# Patient Record
Sex: Female | Born: 1990 | Race: White | Hispanic: No | Marital: Single | Smoking: Current every day smoker
Health system: Southern US, Community
[De-identification: ages and names within clinical notes are randomized; demographics above are authoritative.]

## PROBLEM LIST (undated history)

## (undated) DIAGNOSIS — M549 Dorsalgia, unspecified: Secondary | ICD-10-CM

## (undated) DIAGNOSIS — G8929 Other chronic pain: Secondary | ICD-10-CM

## (undated) HISTORY — PX: BACK SURGERY: SHX140

## (undated) HISTORY — PX: TUBAL LIGATION: SHX77

---

## 2011-09-30 ENCOUNTER — Emergency Department (HOSPITAL_COMMUNITY)
Admission: EM | Admit: 2011-09-30 | Discharge: 2011-09-30 | Disposition: A | Discharge status: Home / Self Care | Payer: Medicaid Other | Attending: Emergency Medicine | Admitting: Emergency Medicine

## 2011-09-30 ENCOUNTER — Encounter (HOSPITAL_COMMUNITY): Payer: Self-pay | Admitting: *Deleted

## 2011-09-30 DIAGNOSIS — M259 Joint disorder, unspecified: Secondary | ICD-10-CM | POA: Insufficient documentation

## 2011-09-30 DIAGNOSIS — O8600 Infection of obstetric surgical wound, unspecified: Secondary | ICD-10-CM

## 2011-09-30 DIAGNOSIS — O85 Puerperal sepsis: Secondary | ICD-10-CM | POA: Insufficient documentation

## 2011-09-30 DIAGNOSIS — M899 Disorder of bone, unspecified: Secondary | ICD-10-CM | POA: Insufficient documentation

## 2011-09-30 DIAGNOSIS — M549 Dorsalgia, unspecified: Secondary | ICD-10-CM | POA: Insufficient documentation

## 2011-09-30 DIAGNOSIS — O9989 Other specified diseases and conditions complicating pregnancy, childbirth and the puerperium: Secondary | ICD-10-CM | POA: Insufficient documentation

## 2011-09-30 MED ORDER — CIPROFLOXACIN HCL 500 MG PO TABS: 500.0000 mg | ORAL_TABLET | Freq: Two times a day (BID) | ORAL | Status: AC

## 2011-09-30 MED ORDER — HYDROCODONE-ACETAMINOPHEN 5-325 MG PO TABS: 1.0000 | ORAL_TABLET | ORAL | Status: AC | PRN

## 2011-09-30 NOTE — ED Notes (Signed)
Dressing changed to abdomen

## 2011-09-30 NOTE — ED Provider Notes (Signed)
History  This chart was scribed for Carleene Cooper III, MD by Erskine Emery. This patient was seen in room APA10/APA10 and the patient's care was started at 15:16.   CSN: [id number]  Arrival date & time 09/30/11  1239   First MD Initiated Contact with Patient 09/30/11 1516      Chief Complaint  Patient presents with  . Wound Infection    (Consider location/radiation/quality/duration/timing/severity/associated sxs/prior treatment) HPI Alison Warner is a 21 y.o. female who presents to the Emergency Department complaining of drainage from a wound for the last few days with associated emesis (2 days ago), and infrequent diarrhea. Pt also reports some back pain of a 10/10 severity since the epidural. Pt denies any ear ache, sore throat, coughing, rash, or syncope. Pt has received no treatment yet. Pt reports her health is otherwise good, she has had no other prior surgeries, she is on no medication, and she does not smoke or drink. Pt had a cesarean section on 7/24 at [facility] because she had a fever going into labor. Pt has NKDA. Pt reports she is seeing her OBGYN on Monday.    History reviewed. No pertinent past medical history.  Past Surgical History  Procedure Date  . Cesarean section     History reviewed. No pertinent family history.  History  Substance Use Topics  . Smoking status: Never Smoker   . Smokeless tobacco: Not on file  . Alcohol Use: No    OB History    Grav Para Term Preterm Abortions TAB SAB Ect Mult Living                  Review of Systems  Constitutional: Negative for fever and chills.  HENT: Negative for ear pain and sore throat.   Eyes: Negative for discharge.  Respiratory: Negative for cough and shortness of breath.   Gastrointestinal: Positive for nausea, vomiting and diarrhea.  Genitourinary: Negative for dysuria. Vaginal discharge: brown.  Musculoskeletal: Positive for back pain.  Skin: Positive for wound (draining). Negative for rash.    Neurological: Negative for syncope and weakness.  Psychiatric/Behavioral: Negative for self-injury.    Allergies  Review of patient's allergies indicates no known allergies.  Home Medications   Current Outpatient Rx  Name Route Sig Dispense Refill  . PRENATAL MULTIVITAMIN CH Oral Take 1 tablet by mouth daily.      Triage Vitals: BP 136/87  Pulse 73  Temp 98.7 F (37.1 C) (Oral)  Resp 18  Ht 5\' 9"  (1.753 m)  Wt 131 lb (59.421 kg)  BMI 19.35 kg/m2  SpO2 99%  Physical Exam  Nursing note and vitals reviewed. Constitutional: She is oriented to person, place, and time. She appears well-developed and well-nourished. No distress.  HENT:  Head: Normocephalic and atraumatic.  Eyes: EOM are normal.  Neck: Neck supple. No tracheal deviation present.  Cardiovascular: Normal rate and normal heart sounds.   Pulmonary/Chest: Effort normal. No respiratory distress.  Abdominal: Soft. Bowel sounds are normal. She exhibits no distension.       Abdomen is soft and nontender.   Musculoskeletal: Normal range of motion. She exhibits no edema.       Back: no palpable deformity.   Neurological: She is alert and oriented to person, place, and time.  Skin: Skin is warm and dry.       Pfannenstiel incision with 0.5 cm opening that drains purulent fluid on abdomen.   Psychiatric: She has a normal mood and affect.    ED Course  Procedures (including critical care time) DIAGNOSTIC STUDIES: Oxygen Saturation is 99% on room air, normal by my interpretation.    COORDINATION OF CARE: 15:41--I evaluated the patient and we discussed a treatment plan including antibiotics (cypro) and pain medication to which the pt agreed. I obtained a culture from the drainage. I instructed the pt to soak the wound in a bath daily to keep it clean and to follow up with her OBGYN.    Labs Reviewed  WOUND CULTURE      1. Wound infection following cesarean section, postpartum   2. Back pain      I  personally performed the services described in this documentation, which was scribed in my presence. The recorded information has been reviewed and considered.  Osvaldo Human, M.D.     Carleene Cooper III, MD 09/30/11 [phone]

## 2011-09-30 NOTE — ED Notes (Signed)
Pt had c section at [facility] 7/24. Now having drainage from incison  And with odor.Manson Passey vag d/c.  Back pain

## 2011-10-03 LAB — WOUND CULTURE

## 2011-11-07 ENCOUNTER — Emergency Department (HOSPITAL_COMMUNITY)
Admission: EM | Admit: 2011-11-07 | Discharge: 2011-11-07 | Disposition: A | Discharge status: Home / Self Care | Payer: Medicaid Other | Attending: Emergency Medicine | Admitting: Emergency Medicine

## 2011-11-07 ENCOUNTER — Emergency Department (HOSPITAL_COMMUNITY): Payer: Medicaid Other

## 2011-11-07 ENCOUNTER — Encounter (HOSPITAL_COMMUNITY): Payer: Self-pay

## 2011-11-07 DIAGNOSIS — S300XXA Contusion of lower back and pelvis, initial encounter: Secondary | ICD-10-CM

## 2011-11-07 DIAGNOSIS — M545 Low back pain, unspecified: Secondary | ICD-10-CM | POA: Insufficient documentation

## 2011-11-07 DIAGNOSIS — G8929 Other chronic pain: Secondary | ICD-10-CM | POA: Insufficient documentation

## 2011-11-07 HISTORY — DX: Other chronic pain: G89.29

## 2011-11-07 HISTORY — DX: Dorsalgia, unspecified: M54.9

## 2011-11-07 LAB — POCT PREGNANCY, URINE: Preg Test, Ur: NEGATIVE

## 2011-11-07 MED ORDER — BACITRACIN ZINC 500 UNIT/GM EX OINT
TOPICAL_OINTMENT | CUTANEOUS | Status: AC
  Filled 2011-11-07: qty 0.9

## 2011-11-07 MED ORDER — HYDROCODONE-ACETAMINOPHEN 5-325 MG PO TABS
1.0000 | ORAL_TABLET | Freq: Once | ORAL | Status: AC
  Administered 2011-11-07: 1 via ORAL
  Filled 2011-11-07: qty 1

## 2011-11-07 MED ORDER — CYCLOBENZAPRINE HCL 10 MG PO TABS: 10.0000 mg | ORAL_TABLET | Freq: Three times a day (TID) | ORAL | Status: DC | PRN

## 2011-11-07 MED ORDER — HYDROCODONE-ACETAMINOPHEN 5-325 MG PO TABS: ORAL_TABLET | ORAL | Status: DC

## 2011-11-07 NOTE — ED Notes (Signed)
Pt slipped on wet steps on Friday, pt states that she hit her back on the steps, pt c/o pain to lower back area.

## 2011-11-07 NOTE — ED Provider Notes (Signed)
History     CSN: [id number]  Arrival date & time 11/07/11  1549   First MD Initiated Contact with Patient 11/07/11 1607      Chief Complaint  Patient presents with  . Fall  . Back Pain    (Consider location/radiation/quality/duration/timing/severity/associated sxs/prior treatment) HPI Comments: Patient c/o pain to her lower back after a fell off her porch 2 days ago.  Pain is worse with movement and improves with rest.  She denies incontinence, urinary sx's, saddle anesthesia's, numbness or motor weakness of the legs, neck pain, head injury or LOC.  States that she had back surgery several years ago and she is concerned that she "messed up something" in her back  Patient is a 21 y.o. female presenting with back pain. The history is provided by the patient.  Back Pain  This is a new problem. The current episode started 2 days ago. The problem occurs constantly. The problem has not changed since onset.The pain is associated with falling. The pain is present in the lumbar spine. The quality of the pain is described as aching. The pain does not radiate. The pain is moderate. The symptoms are aggravated by bending, twisting and certain positions. The pain is the same all the time. Pertinent negatives include no chest pain, no fever, no numbness, no abdominal pain, no abdominal swelling, no bowel incontinence, no perianal numbness, no bladder incontinence, no dysuria, no pelvic pain, no leg pain, no paresthesias, no paresis, no tingling and no weakness. She has tried nothing for the symptoms. The treatment provided no relief.    Past Medical History  Diagnosis Date  . Chronic back pain     Past Surgical History  Procedure Date  . Cesarean section   . Back surgery     No family history on file.  History  Substance Use Topics  . Smoking status: Never Smoker   . Smokeless tobacco: Not on file  . Alcohol Use: No    OB History    Grav Para Term Preterm Abortions TAB SAB Ect Mult  Living                  Review of Systems  Constitutional: Negative for fever.  Respiratory: Negative for shortness of breath.   Cardiovascular: Negative for chest pain.  Gastrointestinal: Negative for vomiting, abdominal pain, constipation and bowel incontinence.  Genitourinary: Negative for bladder incontinence, dysuria, hematuria, flank pain, decreased urine volume, difficulty urinating and pelvic pain.       No perineal numbness or incontinence of urine or feces  Musculoskeletal: Positive for back pain. Negative for joint swelling.  Skin: Negative for rash.  Neurological: Negative for tingling, weakness, numbness and paresthesias.  All other systems reviewed and are negative.    Allergies  Review of patient's allergies indicates no known allergies.  Home Medications   Current Outpatient Rx  Name Route Sig Dispense Refill  . NORGESTIMATE-ETH ESTRADIOL 0.25-35 MG-MCG PO TABS Oral Take 1 tablet by mouth daily.      BP 123/78  Pulse 70  Temp 98.3 F (36.8 C) (Oral)  Resp 20  Ht 5\' 10"  (1.778 m)  Wt 230 lb (104.327 kg)  BMI 33.00 kg/m2  SpO2 100%  LMP 10/24/2011  Physical Exam  Nursing note and vitals reviewed. Constitutional: She is oriented to person, place, and time. She appears well-developed and well-nourished. No distress.  HENT:  Head: Normocephalic and atraumatic.  Neck: Normal range of motion. Neck supple.  Cardiovascular: Normal rate, regular rhythm  and intact distal pulses.   No murmur heard. Pulmonary/Chest: Effort normal and breath sounds normal.  Musculoskeletal: She exhibits tenderness. She exhibits no edema.       Lumbar back: She exhibits tenderness, bony tenderness and pain. She exhibits normal range of motion, no swelling, no deformity, no laceration and normal pulse.       Back:       DP pulses are brisk, distal sensation intact, no calf pain or edema.  Neurological: She is alert and oriented to person, place, and time. No cranial nerve deficit  or sensory deficit. She exhibits normal muscle tone. Coordination and gait normal.  Reflex Scores:      Patellar reflexes are 2+ on the right side and 2+ on the left side.      Achilles reflexes are 2+ on the right side and 2+ on the left side. Skin: Skin is warm and dry.    ED Course  Procedures (including critical care time)    Results for orders placed during the hospital encounter of 11/07/11  POCT PREGNANCY, URINE      Component Value Range   Preg Test, Ur NEGATIVE  NEGATIVE    Dg Lumbar Spine Complete  11/07/2011  *RADIOLOGY REPORT*  Clinical Data: Fall, back pain  LUMBAR SPINE - COMPLETE 4+ VIEW  Comparison: None.  Findings: Five views of the lumbar spine submitted.  No acute fracture or subluxation.  Minimal levoscoliosis.  Alignment, disc spaces and vertebral height are preserved.  IMPRESSION: No acute fracture or subluxation.   Original Report Authenticated By: Natasha Mead, M.D.         MDM     Patient has ttp of the lumbar paraspinal muscles.  No focal neuro deficits on exam.  Ambulates with a steady gait.      The patient appears reasonably screened and/or stabilized for discharge and I doubt any other medical condition or other [facility] requiring further screening, evaluation, or treatment in the ED at this time prior to discharge.   Prescribed: norco #20 flexeril  Gionni Vaca L. [place], [place] 11/09/11 2302

## 2011-11-07 NOTE — ED Notes (Signed)
Pt tripped and fell off porch Friday, cont. To have back pain, had surgery to back several years ago and thinks may have damaged

## 2011-11-10 NOTE — ED Provider Notes (Signed)
Medical screening examination/treatment/procedure(s) were performed by non-physician practitioner and as supervising physician I was immediately available for consultation/collaboration.  Desaree Downen, MD 11/10/11 1717 

## 2011-12-01 ENCOUNTER — Encounter (HOSPITAL_COMMUNITY): Payer: Self-pay | Admitting: Emergency Medicine

## 2011-12-01 ENCOUNTER — Emergency Department (HOSPITAL_COMMUNITY): Payer: Medicaid Other

## 2011-12-01 ENCOUNTER — Emergency Department (HOSPITAL_COMMUNITY)
Admission: EM | Admit: 2011-12-01 | Discharge: 2011-12-01 | Disposition: A | Discharge status: Home / Self Care | Payer: Medicaid Other | Attending: Emergency Medicine | Admitting: Emergency Medicine

## 2011-12-01 DIAGNOSIS — S8990XA Unspecified injury of unspecified lower leg, initial encounter: Secondary | ICD-10-CM

## 2011-12-01 DIAGNOSIS — S8000XA Contusion of unspecified knee, initial encounter: Secondary | ICD-10-CM | POA: Insufficient documentation

## 2011-12-01 DIAGNOSIS — S8001XA Contusion of right knee, initial encounter: Secondary | ICD-10-CM

## 2011-12-01 DIAGNOSIS — G8929 Other chronic pain: Secondary | ICD-10-CM | POA: Insufficient documentation

## 2011-12-01 DIAGNOSIS — W108XXA Fall (on) (from) other stairs and steps, initial encounter: Secondary | ICD-10-CM | POA: Insufficient documentation

## 2011-12-01 MED ORDER — IBUPROFEN 600 MG PO TABS: 600.0000 mg | ORAL_TABLET | Freq: Four times a day (QID) | ORAL | Status: DC | PRN

## 2011-12-01 MED ORDER — KETOROLAC TROMETHAMINE 60 MG/2ML IM SOLN
60.0000 mg | Freq: Once | INTRAMUSCULAR | Status: AC
  Administered 2011-12-01: 60 mg via INTRAMUSCULAR
  Filled 2011-12-01: qty 2

## 2011-12-01 MED ORDER — OXYCODONE-ACETAMINOPHEN 5-325 MG PO TABS: 2.0000 | ORAL_TABLET | ORAL | Status: DC | PRN

## 2011-12-01 MED ORDER — OXYCODONE-ACETAMINOPHEN 5-325 MG PO TABS
1.0000 | ORAL_TABLET | Freq: Once | ORAL | Status: AC
  Administered 2011-12-01: 1 via ORAL
  Filled 2011-12-01: qty 1

## 2011-12-01 NOTE — ED Provider Notes (Signed)
History     CSN: [id number]  Arrival date & time 12/01/11  1901   First MD Initiated Contact with Patient 12/01/11 1957      Chief Complaint  Patient presents with  . Knee Pain    (Consider location/radiation/quality/duration/timing/severity/associated sxs/prior treatment) HPICarol Warner is a 21 y.o. female who presents to the ED with knee pain. The pain is located in the right knee. The pain is a result of injury. The patient fell up a step yesterday and landed on her right knee. She complains of bruising. She has been ambulatory since the injury. The history was provided by the patient.  Past Medical History  Diagnosis Date  . Chronic back pain     Past Surgical History  Procedure Date  . Cesarean section   . Back surgery     History reviewed. No pertinent family history.  History  Substance Use Topics  . Smoking status: Never Smoker   . Smokeless tobacco: Not on file  . Alcohol Use: No    OB History    Grav Para Term Preterm Abortions TAB SAB Ect Mult Living                  Review of Systems  Musculoskeletal: Positive for joint swelling. Negative for back pain.       Right knee pain/injury  Psychiatric/Behavioral: Negative for agitation. The patient is not nervous/anxious.     Allergies  Review of patient's allergies indicates no known allergies.  Home Medications   Current Outpatient Rx  Name Route Sig Dispense Refill  . IBUPROFEN 200 MG PO TABS Oral Take 200 mg by mouth once as needed. For pain    . NORGESTIMATE-ETH ESTRADIOL 0.25-35 MG-MCG PO TABS Oral Take 1 tablet by mouth daily.      BP 119/88  Pulse 79  Temp 98.1 F (36.7 C) (Oral)  Resp 14  Ht 5\' 9"  (1.753 m)  Wt 262 lb 3 oz (118.927 kg)  BMI 38.72 kg/m2  SpO2 100%  LMP 11/23/2011  Physical Exam  Nursing note and vitals reviewed. Constitutional: She is oriented to person, place, and time. She appears well-developed and well-nourished. No distress.  HENT:  Head: Normocephalic and  atraumatic.  Eyes: EOM are normal. Pupils are equal, round, and reactive to light.  Neck: Neck supple.  Cardiovascular: Normal rate.   Pulmonary/Chest: Effort normal.  Musculoskeletal:       Right knee: She exhibits decreased range of motion and swelling. tenderness found. Patellar tendon tenderness noted.       Legs:      Right knee with ecchymosis. Tender over patella with palpation. Pain with flexion. Minimal swelling noted. Pedal pulse present, adequate circulation.  Neurological: She is alert and oriented to person, place, and time. No cranial nerve deficit.  Skin: Skin is warm and dry.  Psychiatric: She has a normal mood and affect. Her behavior is normal. Judgment and thought content normal.    ED Course  Procedures Labs Reviewed - No data to display Dg Knee Complete 4 Views Right  12/01/2011  *RADIOLOGY REPORT*  Clinical Data: Fall.  Right lateral knee pain.  RIGHT KNEE - COMPLETE 4+ VIEW  Comparison: None.  Findings: The right knee is located.  No acute bone or soft tissue abnormality is present.  IMPRESSION: Negative right knee.   Original Report Authenticated By: Jamesetta Orleans. MATTERN, M.D.    Assessment: 21 y.o. female with right knee injury   Contusion right knee  Plan:  Knee  immobilizer, elevate, ice   Pain management   Follow up wit ortho, return here as needed.  Discussed with the patient and all questioned fully answered.   Medication List     As of 12/01/2011 10:06 PM    START taking these medications         * ibuprofen 600 MG tablet   Commonly known as: ADVIL,MOTRIN   Take 1 tablet (600 mg total) by mouth every 6 (six) hours as needed for pain.      oxyCODONE-acetaminophen 5-325 MG per tablet   Commonly known as: PERCOCET/ROXICET   Take 2 tablets by mouth every 4 (four) hours as needed for pain.     * Notice: This list has 1 medication(s) that are the same as other medications prescribed for you. Read the directions carefully, and ask your doctor or  other care provider to review them with you.    ASK your doctor about these medications         * ibuprofen 200 MG tablet   Commonly known as: ADVIL,MOTRIN      SPRINTEC 28 0.25-35 MG-MCG tablet   Generic drug: norgestimate-ethinyl estradiol     * Notice: This list has 1 medication(s) that are the same as other medications prescribed for you. Read the directions carefully, and ask your doctor or other care provider to review them with you.        Where to get your medications    These are the prescriptions that you need to pick up.   You may get these medications from any pharmacy.         ibuprofen 600 MG tablet   oxyCODONE-acetaminophen 5-325 MG per tablet                Janne Napoleon, NP 12/01/11 2206

## 2011-12-01 NOTE — ED Notes (Addendum)
Patient states she fell up the steps landing on right knee yesterday. Complaining of pain and bruising in right knee. Patient ambulatory with no assistance at triage.

## 2011-12-01 NOTE — ED Notes (Signed)
States she fell onto her right knee yesterday.

## 2011-12-01 NOTE — ED Provider Notes (Signed)
Medical screening examination/treatment/procedure(s) were performed by non-physician practitioner and as supervising physician I was immediately available for consultation/collaboration. Devoria Albe, MD, Armando Gang   Ward Givens, MD 12/01/11 2218

## 2011-12-07 ENCOUNTER — Emergency Department (HOSPITAL_COMMUNITY)
Admission: EM | Admit: 2011-12-07 | Discharge: 2011-12-07 | Disposition: A | Discharge status: Home / Self Care | Payer: Medicaid Other | Attending: Emergency Medicine | Admitting: Emergency Medicine

## 2011-12-07 ENCOUNTER — Emergency Department (HOSPITAL_COMMUNITY): Payer: Medicaid Other

## 2011-12-07 ENCOUNTER — Encounter (HOSPITAL_COMMUNITY): Payer: Self-pay

## 2011-12-07 DIAGNOSIS — S20229A Contusion of unspecified back wall of thorax, initial encounter: Secondary | ICD-10-CM | POA: Insufficient documentation

## 2011-12-07 DIAGNOSIS — W010XXA Fall on same level from slipping, tripping and stumbling without subsequent striking against object, initial encounter: Secondary | ICD-10-CM | POA: Insufficient documentation

## 2011-12-07 DIAGNOSIS — Y93E1 Activity, personal bathing and showering: Secondary | ICD-10-CM | POA: Insufficient documentation

## 2011-12-07 DIAGNOSIS — R0602 Shortness of breath: Secondary | ICD-10-CM | POA: Diagnosis present

## 2011-12-07 DIAGNOSIS — Y929 Unspecified place or not applicable: Secondary | ICD-10-CM | POA: Insufficient documentation

## 2011-12-07 DIAGNOSIS — Z9889 Other specified postprocedural states: Secondary | ICD-10-CM | POA: Insufficient documentation

## 2011-12-07 MED ORDER — NAPROXEN 250 MG PO TABS: 500.0000 mg | ORAL_TABLET | Freq: Once | ORAL | Status: DC

## 2011-12-07 MED ORDER — OXYCODONE-ACETAMINOPHEN 5-325 MG PO TABS
ORAL_TABLET | ORAL | Status: AC
  Filled 2011-12-07: qty 1

## 2011-12-07 MED ORDER — NAPROXEN 500 MG PO TABS: 500.0000 mg | ORAL_TABLET | Freq: Two times a day (BID) | ORAL | Status: DC

## 2011-12-07 MED ORDER — OXYCODONE-ACETAMINOPHEN 5-325 MG PO TABS
1.0000 | ORAL_TABLET | Freq: Once | ORAL | Status: AC
  Administered 2011-12-07: 1 via ORAL

## 2011-12-07 MED ORDER — IBUPROFEN 800 MG PO TABS
800.0000 mg | ORAL_TABLET | Freq: Once | ORAL | Status: AC
  Administered 2011-12-07: 800 mg via ORAL
  Filled 2011-12-07: qty 1

## 2011-12-07 MED ORDER — OXYCODONE-ACETAMINOPHEN 5-325 MG PO TABS: 1.0000 | ORAL_TABLET | ORAL | Status: AC | PRN

## 2011-12-07 NOTE — ED Notes (Signed)
Was getting out of the shower and I slipped and fell hitting my back over the side of the tub per pt.

## 2011-12-07 NOTE — ED Provider Notes (Signed)
History     CSN: [id number]  Arrival date & time 12/07/11  2133   First MD Initiated Contact with Patient 12/07/11 2159      Chief Complaint  Patient presents with  . Fall  . Back Pain    (Consider location/radiation/quality/duration/timing/severity/associated sxs/prior treatment) HPI Comments: Patient c/o pain to her lower back that began after she slipped fell getting out of the tub earlier this evening.  Patient states she has chronic low back pain and pain is now worse.  Pain is worse with movement and improves somewhat with rest.  She denies radiation of pain, incontinence, urinary sx's, or numbness or weakness of the leg.    Patient is a 21 y.o. female presenting with fall. The history is provided by the patient.  Fall The accident occurred 1 to 2 hours ago. Incident: while getting out of the shower. Impact surface: edge of the tub. There was no blood loss. Point of impact: lower back. Pain location: lower back. The pain is mild. She was ambulatory at the scene. There was no entrapment after the fall. There was no drug use involved in the accident. There was no alcohol use involved in the accident. Pertinent negatives include no fever, no numbness, no abdominal pain, no nausea, no vomiting, no hematuria, no hearing loss and no loss of consciousness. The symptoms are aggravated by standing and ambulation. She has tried nothing for the symptoms. The treatment provided no relief.    Past Medical History  Diagnosis Date  . Chronic back pain     Past Surgical History  Procedure Date  . Cesarean section   . Back surgery     No family history on file.  History  Substance Use Topics  . Smoking status: Never Smoker   . Smokeless tobacco: Not on file  . Alcohol Use: No    OB History    Grav Para Term Preterm Abortions TAB SAB Ect Mult Living                  Review of Systems  Constitutional: Negative for fever.  HENT: Negative for neck pain.   Respiratory: Negative  for chest tightness and shortness of breath.   Cardiovascular: Negative for chest pain.  Gastrointestinal: Negative for nausea, vomiting, abdominal pain and constipation.  Genitourinary: Negative for dysuria, hematuria, flank pain, decreased urine volume and difficulty urinating.       No perineal numbness or incontinence of urine or feces  Musculoskeletal: Positive for back pain. Negative for joint swelling.  Skin: Negative for rash.  Neurological: Negative for loss of consciousness, weakness and numbness.  All other systems reviewed and are negative.    Allergies  Review of patient's allergies indicates no known allergies.  Home Medications   Current Outpatient Rx  Name Route Sig Dispense Refill  . NORGESTIMATE-ETH ESTRADIOL 0.25-35 MG-MCG PO TABS Oral Take 1 tablet by mouth daily.      BP 131/76  Pulse 90  Temp 98.2 F (36.8 C) (Oral)  Resp 18  Ht 5\' 10"  (1.778 m)  Wt 263 lb (119.296 kg)  BMI 37.74 kg/m2  SpO2 97%  LMP 11/23/2011  Physical Exam  Nursing note and vitals reviewed. Constitutional: She is oriented to person, place, and time. She appears well-developed and well-nourished. No distress.  HENT:  Head: Normocephalic and atraumatic.  Neck: Normal range of motion. Neck supple.  Cardiovascular: Normal rate, regular rhythm, normal heart sounds and intact distal pulses.   No murmur heard. Pulmonary/Chest: Effort  normal and breath sounds normal.  Abdominal: Soft. She exhibits no distension. There is no tenderness. There is no rebound and no guarding.  Musculoskeletal: She exhibits tenderness. She exhibits no edema.       Lumbar back: She exhibits tenderness, bony tenderness and pain. She exhibits normal range of motion, no swelling, no deformity, no laceration and normal pulse.       Back:       ttp of the thoracolumbar region of the back.  No edema, bruising or abrasions.    Neurological: She is alert and oriented to person, place, and time. She has normal  strength. No cranial nerve deficit or sensory deficit. She exhibits normal muscle tone. Coordination and gait normal.  Reflex Scores:      Patellar reflexes are 2+ on the right side and 2+ on the left side.      Achilles reflexes are 2+ on the right side and 2+ on the left side. Skin: Skin is warm and dry.    ED Course  Procedures (including critical care time)  Labs Reviewed - No data to display  Dg Thoracic Spine W/swimmers  12/07/2011  *RADIOLOGY REPORT*  Clinical Data: Back pain status post trauma  THORACIC SPINE - 2 VIEW + SWIMMERS  Comparison: None.  Findings: The imaged vertebral bodies and inter-vertebral disc spaces are maintained. No displaced acute fracture or dislocation identified.   The para-vertebral and overlying soft tissues are within normal limits.  Visualized portions of the lungs are clear.  IMPRESSION: No acute osseous abnormality of the thoracic spine.   Original Report Authenticated By: Waneta Martins, M.D.      MDM    Patient has diffuse ttp of the lower thoracic spine and paraspinal muscles.  No bruising, abrasions or edema.  Moves all extremities w/o difficulty.  No focal neuro deficits on exam.  Chest wall is NT, abd is also NT.  Patient does NOT have shortness of breath.     Pt agrees to apply ice , rest, close f/u with her PMD for recheck or to return here of her sx's worsen.  Prescribed:  Naproxen Percocet #12    Lezlie Ritchey L. [place], [place] 12/08/11 0127     Diagnosis of shortness of breath was an incorrect diagnosis. It was linked in EPIC to the x-ray order and would not allow me to delete it.  The patient correct diagnosis was contusion of the mid back.    Gaynell Eggleton L. [place], [place] 12/08/11 1610

## 2011-12-10 NOTE — ED Provider Notes (Signed)
Medical screening examination/treatment/procedure(s) were performed by non-physician practitioner and as supervising physician I was immediately available for consultation/collaboration.   Benny Lennert, MD 12/10/11 1447

## 2011-12-30 ENCOUNTER — Emergency Department (HOSPITAL_COMMUNITY): Payer: Medicaid Other

## 2011-12-30 ENCOUNTER — Emergency Department (HOSPITAL_COMMUNITY)
Admission: EM | Admit: 2011-12-30 | Discharge: 2011-12-30 | Disposition: A | Discharge status: Home / Self Care | Payer: Medicaid Other | Attending: Emergency Medicine | Admitting: Emergency Medicine

## 2011-12-30 ENCOUNTER — Encounter (HOSPITAL_COMMUNITY): Payer: Self-pay | Admitting: *Deleted

## 2011-12-30 DIAGNOSIS — R21 Rash and other nonspecific skin eruption: Secondary | ICD-10-CM | POA: Insufficient documentation

## 2011-12-30 DIAGNOSIS — W1789XA Other fall from one level to another, initial encounter: Secondary | ICD-10-CM | POA: Insufficient documentation

## 2011-12-30 DIAGNOSIS — Z79899 Other long term (current) drug therapy: Secondary | ICD-10-CM | POA: Insufficient documentation

## 2011-12-30 DIAGNOSIS — G8929 Other chronic pain: Secondary | ICD-10-CM | POA: Insufficient documentation

## 2011-12-30 DIAGNOSIS — S93401A Sprain of unspecified ligament of right ankle, initial encounter: Secondary | ICD-10-CM

## 2011-12-30 DIAGNOSIS — S60229A Contusion of unspecified hand, initial encounter: Secondary | ICD-10-CM | POA: Insufficient documentation

## 2011-12-30 DIAGNOSIS — S60221A Contusion of right hand, initial encounter: Secondary | ICD-10-CM

## 2011-12-30 DIAGNOSIS — Y93H1 Activity, digging, shoveling and raking: Secondary | ICD-10-CM | POA: Insufficient documentation

## 2011-12-30 DIAGNOSIS — M549 Dorsalgia, unspecified: Secondary | ICD-10-CM | POA: Insufficient documentation

## 2011-12-30 DIAGNOSIS — Y929 Unspecified place or not applicable: Secondary | ICD-10-CM | POA: Insufficient documentation

## 2011-12-30 DIAGNOSIS — S93409A Sprain of unspecified ligament of unspecified ankle, initial encounter: Secondary | ICD-10-CM | POA: Insufficient documentation

## 2011-12-30 MED ORDER — HYDROCODONE-ACETAMINOPHEN 5-325 MG PO TABS
1.0000 | ORAL_TABLET | Freq: Once | ORAL | Status: AC
  Administered 2011-12-30: 1 via ORAL
  Filled 2011-12-30: qty 1

## 2011-12-30 MED ORDER — IBUPROFEN 800 MG PO TABS
800.0000 mg | ORAL_TABLET | Freq: Once | ORAL | Status: AC
  Administered 2011-12-30: 800 mg via ORAL
  Filled 2011-12-30: qty 1

## 2011-12-30 MED ORDER — TRIAMCINOLONE ACETONIDE 0.1 % EX CREA: TOPICAL_CREAM | Freq: Two times a day (BID) | CUTANEOUS | Status: DC

## 2011-12-30 MED ORDER — HYDROCODONE-ACETAMINOPHEN 5-325 MG PO TABS: 1.0000 | ORAL_TABLET | Freq: Four times a day (QID) | ORAL | Status: AC | PRN

## 2011-12-30 NOTE — ED Provider Notes (Signed)
History     CSN: [id number]  Arrival date & time 12/30/11  0906   First MD Initiated Contact with Patient 12/30/11 [phone]      Chief Complaint  Patient presents with  . Rash  . Ankle Pain    (Consider location/radiation/quality/duration/timing/severity/associated sxs/prior treatment) HPI Comments: States she was raking leaves yest and stepped in a hole and fell.  Injured R ankle and R hand.  She also notes an itchy rash on B hands.  Patient is a 21 y.o. female presenting with rash and ankle pain. The history is provided by the patient. No language interpreter was used.  Rash  This is a new problem. The current episode started yesterday. The problem has not changed since onset.There has been no fever. The pain is moderate. She has tried nothing for the symptoms.  Ankle Pain     Past Medical History  Diagnosis Date  . Chronic back pain     Past Surgical History  Procedure Date  . Cesarean section   . Back surgery     No family history on file.  History  Substance Use Topics  . Smoking status: Never Smoker   . Smokeless tobacco: Not on file  . Alcohol Use: No    OB History    Grav Para Term Preterm Abortions TAB SAB Ect Mult Living                  Review of Systems  Constitutional: Negative for fever and chills.  Musculoskeletal:       Hand and ankle injuries   Skin: Positive for rash.  All other systems reviewed and are negative.    Allergies  Review of patient's allergies indicates no known allergies.  Home Medications   Current Outpatient Rx  Name  Route  Sig  Dispense  Refill  . NORGESTIMATE-ETH ESTRADIOL 0.25-35 MG-MCG PO TABS   Oral   Take 1 tablet by mouth daily.         Marland Kitchen HYDROCODONE-ACETAMINOPHEN 5-325 MG PO TABS   Oral   Take 1 tablet by mouth every 6 (six) hours as needed for pain.   20 tablet   0   . TRIAMCINOLONE ACETONIDE 0.1 % EX CREA   Topical   Apply topically 2 (two) times daily.   30 g   0     BP 117/76  Pulse 86   Temp 98 F (36.7 C) (Oral)  Resp 20  Ht 5\' 9"  (1.753 m)  Wt 240 lb (108.863 kg)  BMI 35.44 kg/m2  SpO2 100%  LMP 12/13/2011  Physical Exam  Nursing note and vitals reviewed. Constitutional: She is oriented to person, place, and time. She appears well-developed and well-nourished. No distress.  HENT:  Head: Normocephalic and atraumatic.  Eyes: EOM are normal.  Neck: Normal range of motion.  Cardiovascular: Normal rate and regular rhythm.   Pulmonary/Chest: Effort normal.  Abdominal: Soft. She exhibits no distension. There is no tenderness.  Musculoskeletal: She exhibits tenderness.       Hands:      Feet:  Neurological: She is alert and oriented to person, place, and time.  Skin: Skin is warm and dry.  Psychiatric: She has a normal mood and affect. Judgment normal.    ED Course  Procedures (including critical care time)  Labs Reviewed - No data to display Dg Ankle Complete Right  12/30/2011  *RADIOLOGY REPORT*  Clinical Data: Stepped in a hole yesterday and twisted ankle. Pain and swelling lateral malleolus.  RIGHT ANKLE - COMPLETE 3+ VIEW  Comparison: None.  Findings: Mild soft tissue swelling noted over the lateral malleolus without underlying fracture observed.  Plafond and talar dome appear unremarkable.  Incidental os trigonum.  IMPRESSION:  1.  Mild soft tissue swelling laterally without underlying osseous abnormality observed.   Original Report Authenticated By: Gaylyn Rong, M.D.    Dg Hand Complete Right  12/30/2011  *RADIOLOGY REPORT*  Clinical Data: History of fall complaining of pain in the right hand.  RIGHT HAND - COMPLETE 3+ VIEW  Comparison: No priors.  Findings: Three views of right hand demonstrate no acute displaced fracture, subluxation, dislocation, joint or soft tissue abnormality.  IMPRESSION: 1.  No acute radiographic abnormality of the right hand.   Original Report Authenticated By: Trudie Reed, M.D.      1. Right ankle sprain   2.  Contusion of right hand   3. Rash       MDM  Ice, elevation, crutches rx-hydrocodone, 20 rx-triamcinolone cream, 0.1 %, 30 gm F/u with dr. Carollee Herter, PA 12/30/11 1141

## 2011-12-30 NOTE — ED Provider Notes (Signed)
Medical screening examination/treatment/procedure(s) were performed by non-physician practitioner and as supervising physician I was immediately available for consultation/collaboration.   Carleene Cooper III, MD 12/30/11 [phone]

## 2011-12-30 NOTE — ED Notes (Signed)
Reports fell yesterday while raking leaves, twisting right ankle.  Also reports rash and bruising to bil hands.

## 2012-01-12 ENCOUNTER — Emergency Department (HOSPITAL_COMMUNITY)
Admission: EM | Admit: 2012-01-12 | Discharge: 2012-01-12 | Disposition: A | Discharge status: Home / Self Care | Payer: Medicaid Other | Attending: Emergency Medicine | Admitting: Emergency Medicine

## 2012-01-12 ENCOUNTER — Encounter (HOSPITAL_COMMUNITY): Payer: Self-pay | Admitting: *Deleted

## 2012-01-12 DIAGNOSIS — M25579 Pain in unspecified ankle and joints of unspecified foot: Secondary | ICD-10-CM | POA: Insufficient documentation

## 2012-01-12 DIAGNOSIS — J3489 Other specified disorders of nose and nasal sinuses: Secondary | ICD-10-CM | POA: Insufficient documentation

## 2012-01-12 DIAGNOSIS — M2550 Pain in unspecified joint: Secondary | ICD-10-CM | POA: Insufficient documentation

## 2012-01-12 DIAGNOSIS — M25571 Pain in right ankle and joints of right foot: Secondary | ICD-10-CM

## 2012-01-12 DIAGNOSIS — M542 Cervicalgia: Secondary | ICD-10-CM | POA: Insufficient documentation

## 2012-01-12 DIAGNOSIS — G8929 Other chronic pain: Secondary | ICD-10-CM | POA: Insufficient documentation

## 2012-01-12 DIAGNOSIS — M549 Dorsalgia, unspecified: Secondary | ICD-10-CM | POA: Insufficient documentation

## 2012-01-12 MED ORDER — ACETAMINOPHEN-CODEINE #3 300-30 MG PO TABS: 1.0000 | ORAL_TABLET | Freq: Four times a day (QID) | ORAL | Status: DC | PRN

## 2012-01-12 MED ORDER — MELOXICAM 7.5 MG PO TABS: ORAL_TABLET | ORAL | Status: DC

## 2012-01-12 NOTE — ED Provider Notes (Signed)
Medical screening examination/treatment/procedure(s) were performed by non-physician practitioner and as supervising physician I was immediately available for consultation/collaboration. Tishara Pizano, MD, FACEP   Aidel Davisson L Leon Goodnow, MD 01/12/12 2150 

## 2012-01-12 NOTE — ED Provider Notes (Signed)
History     CSN: [id number]  Arrival date & time 01/12/12  1801   First MD Initiated Contact with Patient 01/12/12 1828      Chief Complaint  Patient presents with  . Leg Pain    (Consider location/radiation/quality/duration/timing/severity/associated sxs/prior treatment) HPI Comments: Patient states she has had pain of her lower extremities since age 21. She was told during the age 20-15 year range that she may have some" growing pains". The patient has not had this evaluated since that time. The patient states that during her pregnancy approximately 4 months ago that she had less pain but still had pain of her ankles. She now has pain with both sitting, standing, and walking. The patient states that time she has pain from the knees down to the ankles. At other times she has pain from the ankles going back up to the knees. There is no posterior calf area pain reported. The pain affects both sides and the one side more than the other. The patient states she has tried elevating her legs, Tylenol, and ibuprofen without any success she presents at this time for additional evaluation.  The history is provided by the patient.    Past Medical History  Diagnosis Date  . Chronic back pain     Past Surgical History  Procedure Date  . Cesarean section   . Back surgery     History reviewed. No pertinent family history.  History  Substance Use Topics  . Smoking status: Never Smoker   . Smokeless tobacco: Not on file  . Alcohol Use: No    OB History    Grav Para Term Preterm Abortions TAB SAB Ect Mult Living                  Review of Systems  Constitutional: Negative for activity change.       All ROS Neg except as noted in HPI  HENT: Positive for neck pain and sinus pressure. Negative for nosebleeds, sore throat and neck stiffness.   Eyes: Negative for photophobia and discharge.  Respiratory: Negative for cough, shortness of breath and wheezing.   Cardiovascular: Negative  for chest pain and palpitations.  Gastrointestinal: Negative for abdominal pain and blood in stool.  Genitourinary: Negative for dysuria, frequency and hematuria.  Musculoskeletal: Positive for back pain and arthralgias.  Skin: Negative.   Neurological: Negative for dizziness, seizures and speech difficulty.  Psychiatric/Behavioral: Negative for hallucinations and confusion.    Allergies  Review of patient's allergies indicates no known allergies.  Home Medications   Current Outpatient Rx  Name  Route  Sig  Dispense  Refill  . NORGESTIMATE-ETH ESTRADIOL 0.25-35 MG-MCG PO TABS   Oral   Take 1 tablet by mouth daily.         . TRIAMCINOLONE ACETONIDE 0.1 % EX CREA   Topical   Apply topically 2 (two) times daily.   30 g   0     BP 129/62  Pulse 81  Temp 97.8 F (36.6 C) (Oral)  Resp 20  Ht 5\' 10"  (1.778 m)  Wt 220 lb (99.791 kg)  BMI 31.57 kg/m2  SpO2 97%  LMP 12/19/2011  Physical Exam  Nursing note and vitals reviewed. Constitutional: She is oriented to person, place, and time. She appears well-developed and well-nourished.  Non-toxic appearance.  HENT:  Head: Normocephalic.  Right Ear: Tympanic membrane and external ear normal.  Left Ear: Tympanic membrane and external ear normal.  Eyes: EOM and lids are normal.  Pupils are equal, round, and reactive to light.  Neck: Normal range of motion. Neck supple. Carotid bruit is not present.  Cardiovascular: Normal rate, regular rhythm, normal heart sounds, intact distal pulses and normal pulses.   Pulmonary/Chest: Breath sounds normal. No respiratory distress.  Abdominal: Soft. Bowel sounds are normal. There is no tenderness. There is no guarding.  Musculoskeletal: Normal range of motion.       Patient walks with a stiff gait in an attempt not to move her ankles. She complains of pain with range of motion of the ankles. The patient has mild stiffness of the knees. There is no effusion appreciated. There no hot areas  appreciated. There is full range of motion of both hips without pain or problem. There is no pain to percussion over the lower lumbar spine area.  Lymphadenopathy:       Head (right side): No submandibular adenopathy present.       Head (left side): No submandibular adenopathy present.    She has no cervical adenopathy.  Neurological: She is alert and oriented to person, place, and time. She has normal strength. No cranial nerve deficit or sensory deficit.  Skin: Skin is warm and dry.  Psychiatric: She has a normal mood and affect. Her speech is normal.    ED Course  Procedures (including critical care time)  Labs Reviewed - No data to display No results found.   1. Bilateral ankle pain       MDM  I have reviewed nursing notes, vital signs, and all appropriate lab and imaging results for this patient. Examination is suspicious for bilateral ankle pain and or arthritis. And as a result of this possible knee changes to compensate for change in gait.No gross neuro changes, and no exam changes of DVT noted. I  suggested to the patient that it is extremely important that she see an orthopedist for formal evaluation and management of this problem. Prescription for Mobic 7.5 mg twice daily and Tylenol with codeine #3 one or 2 every 6 hours as needed for pain given to the patient. She is to return if any changes or problem.       Kathie Dike, [place] 01/12/12 1927

## 2012-01-12 NOTE — ED Notes (Signed)
Bil lower leg pain "for years", says began again last pm and continues today  No injury.

## 2012-02-13 ENCOUNTER — Emergency Department (HOSPITAL_COMMUNITY)
Admission: EM | Admit: 2012-02-13 | Discharge: 2012-02-13 | Disposition: A | Discharge status: Home / Self Care | Payer: Medicaid Other | Attending: Emergency Medicine | Admitting: Emergency Medicine

## 2012-02-13 ENCOUNTER — Encounter (HOSPITAL_COMMUNITY): Payer: Self-pay

## 2012-02-13 DIAGNOSIS — Z79899 Other long term (current) drug therapy: Secondary | ICD-10-CM | POA: Insufficient documentation

## 2012-02-13 DIAGNOSIS — M2550 Pain in unspecified joint: Secondary | ICD-10-CM | POA: Insufficient documentation

## 2012-02-13 DIAGNOSIS — W208XXA Other cause of strike by thrown, projected or falling object, initial encounter: Secondary | ICD-10-CM | POA: Insufficient documentation

## 2012-02-13 DIAGNOSIS — G8929 Other chronic pain: Secondary | ICD-10-CM | POA: Insufficient documentation

## 2012-02-13 DIAGNOSIS — M545 Low back pain, unspecified: Secondary | ICD-10-CM | POA: Insufficient documentation

## 2012-02-13 DIAGNOSIS — S20219A Contusion of unspecified front wall of thorax, initial encounter: Secondary | ICD-10-CM | POA: Insufficient documentation

## 2012-02-13 DIAGNOSIS — Y929 Unspecified place or not applicable: Secondary | ICD-10-CM | POA: Insufficient documentation

## 2012-02-13 DIAGNOSIS — Y9389 Activity, other specified: Secondary | ICD-10-CM | POA: Insufficient documentation

## 2012-02-13 MED ORDER — METHOCARBAMOL 500 MG PO TABS: ORAL_TABLET | ORAL | Status: DC

## 2012-02-13 MED ORDER — METHOCARBAMOL 500 MG PO TABS
1000.0000 mg | ORAL_TABLET | Freq: Once | ORAL | Status: AC
  Administered 2012-02-13: 1000 mg via ORAL
  Filled 2012-02-13: qty 2

## 2012-02-13 MED ORDER — KETOROLAC TROMETHAMINE 10 MG PO TABS
10.0000 mg | ORAL_TABLET | Freq: Once | ORAL | Status: AC
  Administered 2012-02-13: 10 mg via ORAL
  Filled 2012-02-13: qty 1

## 2012-02-13 MED ORDER — MELOXICAM 7.5 MG PO TABS: ORAL_TABLET | ORAL | Status: DC

## 2012-02-13 NOTE — ED Provider Notes (Signed)
History     CSN: [id number]  Arrival date & time 02/13/12  1614   First MD Initiated Contact with Patient 02/13/12 1720      Chief Complaint  Patient presents with  . Back Pain    (Consider location/radiation/quality/duration/timing/severity/associated sxs/prior treatment) Patient is a 21 y.o. female presenting with back pain. The history is provided by the patient.  Back Pain  This is a new problem. The current episode started 3 to 5 hours ago. The problem occurs constantly. The problem has been gradually worsening. Associated with: a small tv fell on her back. The pain is present in the lumbar spine. The quality of the pain is described as aching. The pain does not radiate. The pain is moderate. The symptoms are aggravated by certain positions. The pain is the same all the time. Pertinent negatives include no chest pain, no numbness, no abdominal pain, no bowel incontinence, no perianal numbness, no bladder incontinence, no dysuria and no tingling. She has tried nothing for the symptoms. Risk factors: hx of chronic back pain.    Past Medical History  Diagnosis Date  . Chronic back pain     Past Surgical History  Procedure Date  . Cesarean section   . Back surgery     No family history on file.  History  Substance Use Topics  . Smoking status: Never Smoker   . Smokeless tobacco: Not on file  . Alcohol Use: No    OB History    Grav Para Term Preterm Abortions TAB SAB Ect Mult Living                  Review of Systems  Constitutional: Negative for activity change.       All ROS Neg except as noted in HPI  HENT: Negative for nosebleeds and neck pain.   Eyes: Negative for photophobia and discharge.  Respiratory: Negative for cough, shortness of breath and wheezing.   Cardiovascular: Negative for chest pain and palpitations.  Gastrointestinal: Negative for abdominal pain, blood in stool and bowel incontinence.  Genitourinary: Negative for bladder incontinence,  dysuria, frequency and hematuria.  Musculoskeletal: Positive for back pain and arthralgias.  Skin: Negative.   Neurological: Negative for dizziness, tingling, seizures, speech difficulty and numbness.  Psychiatric/Behavioral: Negative for hallucinations and confusion.    Allergies  Review of patient's allergies indicates no known allergies.  Home Medications   Current Outpatient Rx  Name  Route  Sig  Dispense  Refill  . NORGESTIMATE-ETH ESTRADIOL 0.25-35 MG-MCG PO TABS   Oral   Take 1 tablet by mouth daily.           BP 136/81  Pulse 71  Temp 98.1 F (36.7 C) (Oral)  Resp 24  Ht 5\' 9"  (1.753 m)  Wt 215 lb (97.523 kg)  BMI 31.75 kg/m2  SpO2 100%  LMP 01/18/2012  Physical Exam  Nursing note and vitals reviewed. Constitutional: She is oriented to person, place, and time. She appears well-developed and well-nourished.  Non-toxic appearance.  HENT:  Head: Normocephalic.  Right Ear: Tympanic membrane and external ear normal.  Left Ear: Tympanic membrane and external ear normal.  Eyes: EOM and lids are normal. Pupils are equal, round, and reactive to light.  Neck: Normal range of motion. Neck supple. Carotid bruit is not present.  Cardiovascular: Normal rate, regular rhythm, normal heart sounds, intact distal pulses and normal pulses.   Pulmonary/Chest: Effort normal and breath sounds normal. No respiratory distress.  Abdominal: Soft. Bowel sounds  are normal. There is no tenderness. There is no guarding.  Musculoskeletal: Normal range of motion.       Sore on the left posterior flank. No bruise. No hematoma. No palpable deformity of the posterior ribs.  Lymphadenopathy:       Head (right side): No submandibular adenopathy present.       Head (left side): No submandibular adenopathy present.    She has no cervical adenopathy.  Neurological: She is alert and oriented to person, place, and time. She has normal strength. No cranial nerve deficit or sensory deficit. She  exhibits normal muscle tone. Coordination normal.  Skin: Skin is warm and dry.  Psychiatric: She has a normal mood and affect. Her speech is normal.    ED Course  Procedures (including critical care time)  Labs Reviewed - No data to display No results found.   No diagnosis found.    MDM  I have reviewed nursing notes, vital signs, and all appropriate lab and imaging results for this patient. The exam is negative for hematoma, or palpable deformity of the left posterior flank. Suspect a contusion. Will treat with ice, robaxin and mobic. Pt to see her Medicaid Access MD if not improving.       Kathie Dike, [place] 02/13/12 1757

## 2012-02-13 NOTE — ED Notes (Signed)
Pt reports cleaning out closest and small tv fell on her back.

## 2012-02-13 NOTE — ED Notes (Signed)
Pt reports having a TV fall on her from the closet shelves on christmas day. Pt has softball size bruising to lower left sacral area of back. NAD noted. Denies radiation of said pain.

## 2012-02-14 NOTE — ED Provider Notes (Signed)
Medical screening examination/treatment/procedure(s) were performed by non-physician practitioner and as supervising physician I was immediately available for consultation/collaboration.  Soleia Badolato, MD 02/14/12 2055 

## 2012-03-19 ENCOUNTER — Emergency Department (HOSPITAL_COMMUNITY): Payer: Medicaid Other

## 2012-03-19 ENCOUNTER — Emergency Department (HOSPITAL_COMMUNITY)
Admission: EM | Admit: 2012-03-19 | Discharge: 2012-03-19 | Disposition: A | Discharge status: Home / Self Care | Payer: Medicaid Other | Attending: Emergency Medicine | Admitting: Emergency Medicine

## 2012-03-19 ENCOUNTER — Encounter (HOSPITAL_COMMUNITY): Payer: Self-pay | Admitting: Emergency Medicine

## 2012-03-19 DIAGNOSIS — W208XXA Other cause of strike by thrown, projected or falling object, initial encounter: Secondary | ICD-10-CM | POA: Insufficient documentation

## 2012-03-19 DIAGNOSIS — S99919A Unspecified injury of unspecified ankle, initial encounter: Secondary | ICD-10-CM

## 2012-03-19 DIAGNOSIS — Z8739 Personal history of other diseases of the musculoskeletal system and connective tissue: Secondary | ICD-10-CM | POA: Insufficient documentation

## 2012-03-19 DIAGNOSIS — S8990XA Unspecified injury of unspecified lower leg, initial encounter: Secondary | ICD-10-CM | POA: Insufficient documentation

## 2012-03-19 DIAGNOSIS — Y929 Unspecified place or not applicable: Secondary | ICD-10-CM | POA: Insufficient documentation

## 2012-03-19 DIAGNOSIS — Y9389 Activity, other specified: Secondary | ICD-10-CM | POA: Insufficient documentation

## 2012-03-19 MED ORDER — HYDROCODONE-ACETAMINOPHEN 5-325 MG PO TABS: 1.0000 | ORAL_TABLET | ORAL | Status: DC | PRN

## 2012-03-19 MED ORDER — NAPROXEN 500 MG PO TABS: 500.0000 mg | ORAL_TABLET | Freq: Two times a day (BID) | ORAL | Status: DC

## 2012-03-19 NOTE — ED Notes (Signed)
Pt states that ankle twisted yesterday but today a piece of wood fell on her right ankle, swelling and bruising noted to ankle

## 2012-03-19 NOTE — ED Notes (Signed)
Patient transported to X-ray 

## 2012-03-19 NOTE — ED Provider Notes (Signed)
History     CSN: [id number]  Arrival date & time 03/19/12  1451   First MD Initiated Contact with Patient 03/19/12 1536      Chief Complaint  Patient presents with  . Ankle Pain   HPI Alison Warner is a 22 y.o. female who presents to the ED with ankle pain. This is a new problem.  The pain is located in the right ankle lateral aspect. The onset was sudden. She was helping cut wood and a log fell on her ankle. She twisted the ankle in addition. She complains of pain but denies any other injuries. The history was provided by the patient.  Past Medical History  Diagnosis Date  . Chronic back pain     Past Surgical History  Procedure Date  . Cesarean section   . Back surgery     No family history on file.  History  Substance Use Topics  . Smoking status: Never Smoker   . Smokeless tobacco: Not on file  . Alcohol Use: No    OB History    Grav Para Term Preterm Abortions TAB SAB Ect Mult Living                  Review of Systems  Constitutional: Negative for fever and chills.  HENT: Negative.   Respiratory: Negative for cough.   Cardiovascular: Negative for chest pain.  Gastrointestinal: Negative for nausea, vomiting and abdominal pain.  Musculoskeletal:       Right ankle swelling and pain  Neurological: Negative for dizziness.  Psychiatric/Behavioral: Negative for behavioral problems. The patient is not nervous/anxious.     Allergies  Review of patient's allergies indicates no known allergies.  Home Medications   Current Outpatient Rx  Name  Route  Sig  Dispense  Refill  . NORGESTIMATE-ETH ESTRADIOL 0.25-35 MG-MCG PO TABS   Oral   Take 1 tablet by mouth daily.           BP 152/94  Pulse 82  Temp 97.4 F (36.3 C) (Oral)  Resp 24  Ht 5\' 10"  (1.778 m)  Wt 210 lb (95.255 kg)  BMI 30.13 kg/m2  SpO2 100%  LMP 03/19/2012  Physical Exam  Nursing note and vitals reviewed. Constitutional: She is oriented to person, place, and time. She appears  well-developed and well-nourished.  HENT:  Head: Normocephalic and atraumatic.  Eyes: EOM are normal.  Neck: Neck supple.  Cardiovascular: Normal rate.   Pulmonary/Chest: Effort normal. No respiratory distress.  Musculoskeletal:       Right ankle: She exhibits decreased range of motion and swelling. She exhibits normal pulse. tenderness. Lateral malleolus tenderness found. Achilles tendon normal.       Feet:       Pedal pulse strong, adequate circulation, good touch sensation. Ecchymosis, swelling, tenderness right lateral ankle.  Neurological: She is alert and oriented to person, place, and time. No cranial nerve deficit.  Skin: Skin is warm and dry.  Psychiatric: She has a normal mood and affect. Her behavior is normal. Judgment and thought content normal.   Procedures   Labs Reviewed - No data to display Dg Ankle Complete Right  03/19/2012  *RADIOLOGY REPORT*  Clinical Data: Trauma to right ankle, bruising/swelling overlying the lateral malleolus  RIGHT ANKLE - COMPLETE 3+ VIEW  Comparison: 12/30/2011  Findings: No fracture or dislocation is seen.  The ankle mortise is intact.  The base of the fifth metatarsal is unremarkable.  Mild lateral soft tissue swelling.  IMPRESSION: No fracture  or dislocation is seen.  Mild lateral soft tissue swelling.   Original Report Authenticated By: Charline Bills, M.D.    Assessment: 22 y.o. female with contusion and sprain of right ankle  Plan:  ASO, Crutches, ice, elevate   Follow up with ortho prn   Naproxen  I have reviewed this patient's vital signs, nurses notes, appropriate imaging and discussed finding and plan of care with the patient. She voices understanding.   Medication List     As of 03/19/2012  3:56 PM    START taking these medications         HYDROcodone-acetaminophen 5-325 MG per tablet   Commonly known as: NORCO/VICODIN   Take 1 tablet by mouth every 4 (four) hours as needed for pain.      naproxen 500 MG tablet   Commonly  known as: NAPROSYN   Take 1 tablet (500 mg total) by mouth 2 (two) times daily.      ASK your doctor about these medications         SPRINTEC 28 0.25-35 MG-MCG tablet   Generic drug: norgestimate-ethinyl estradiol          Where to get your medications    These are the prescriptions that you need to pick up.   You may get these medications from any pharmacy.         HYDROcodone-acetaminophen 5-325 MG per tablet   naproxen 500 MG tablet                 Janne Napoleon, NP 03/19/12 1556

## 2012-03-19 NOTE — ED Notes (Signed)
Pt c/o right ankle pain after falling and twisting it x 1 hour ago.

## 2012-03-20 NOTE — ED Provider Notes (Signed)
Medical screening examination/treatment/procedure(s) were performed by non-physician practitioner and as supervising physician I was immediately available for consultation/collaboration.   Rosalee Tolley M Coty Student, MD 03/20/12 1115 

## 2012-04-24 ENCOUNTER — Encounter (HOSPITAL_COMMUNITY): Payer: Self-pay | Admitting: *Deleted

## 2012-04-24 ENCOUNTER — Emergency Department (HOSPITAL_COMMUNITY): Payer: Medicaid Other

## 2012-04-24 ENCOUNTER — Emergency Department (HOSPITAL_COMMUNITY)
Admission: EM | Admit: 2012-04-24 | Discharge: 2012-04-24 | Disposition: A | Discharge status: Home / Self Care | Payer: Medicaid Other | Attending: Emergency Medicine | Admitting: Emergency Medicine

## 2012-04-24 DIAGNOSIS — Y9389 Activity, other specified: Secondary | ICD-10-CM | POA: Insufficient documentation

## 2012-04-24 DIAGNOSIS — S39012A Strain of muscle, fascia and tendon of lower back, initial encounter: Secondary | ICD-10-CM

## 2012-04-24 DIAGNOSIS — M549 Dorsalgia, unspecified: Secondary | ICD-10-CM | POA: Insufficient documentation

## 2012-04-24 DIAGNOSIS — Z79899 Other long term (current) drug therapy: Secondary | ICD-10-CM | POA: Insufficient documentation

## 2012-04-24 DIAGNOSIS — Z3202 Encounter for pregnancy test, result negative: Secondary | ICD-10-CM | POA: Insufficient documentation

## 2012-04-24 DIAGNOSIS — S335XXA Sprain of ligaments of lumbar spine, initial encounter: Secondary | ICD-10-CM | POA: Insufficient documentation

## 2012-04-24 DIAGNOSIS — S139XXA Sprain of joints and ligaments of unspecified parts of neck, initial encounter: Secondary | ICD-10-CM | POA: Insufficient documentation

## 2012-04-24 DIAGNOSIS — G8929 Other chronic pain: Secondary | ICD-10-CM | POA: Insufficient documentation

## 2012-04-24 DIAGNOSIS — Y9289 Other specified places as the place of occurrence of the external cause: Secondary | ICD-10-CM | POA: Insufficient documentation

## 2012-04-24 MED ORDER — IBUPROFEN 800 MG PO TABS
800.0000 mg | ORAL_TABLET | Freq: Once | ORAL | Status: AC
  Administered 2012-04-24: 800 mg via ORAL
  Filled 2012-04-24: qty 1

## 2012-04-24 MED ORDER — HYDROCODONE-ACETAMINOPHEN 5-325 MG PO TABS: 1.0000 | ORAL_TABLET | ORAL | Status: DC | PRN

## 2012-04-24 MED ORDER — IBUPROFEN 600 MG PO TABS: 600.0000 mg | ORAL_TABLET | Freq: Four times a day (QID) | ORAL | Status: DC | PRN

## 2012-04-24 MED ORDER — HYDROCODONE-ACETAMINOPHEN 5-325 MG PO TABS
1.0000 | ORAL_TABLET | Freq: Once | ORAL | Status: AC
  Administered 2012-04-24: 1 via ORAL
  Filled 2012-04-24: qty 1

## 2012-04-24 NOTE — ED Notes (Signed)
Patient stated medication did not help relieve pain at all. Notified Burgess Amor PA.
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
## 2012-04-24 NOTE — ED Notes (Signed)
c-collar placed in triage

## 2012-04-25 NOTE — ED Provider Notes (Signed)
Medical screening examination/treatment/procedure(s) were performed by non-physician practitioner and as supervising physician I was immediately available for consultation/collaboration.   Charles B. Bernette Mayers, MD 04/25/12 6578

## 2012-06-08 ENCOUNTER — Encounter (HOSPITAL_COMMUNITY): Payer: Self-pay | Admitting: *Deleted

## 2012-06-08 ENCOUNTER — Emergency Department (HOSPITAL_COMMUNITY): Payer: Medicaid Other

## 2012-06-08 ENCOUNTER — Emergency Department (HOSPITAL_COMMUNITY)
Admission: EM | Admit: 2012-06-08 | Discharge: 2012-06-08 | Disposition: A | Discharge status: Home / Self Care | Payer: Medicaid Other | Attending: Emergency Medicine | Admitting: Emergency Medicine

## 2012-06-08 DIAGNOSIS — Y929 Unspecified place or not applicable: Secondary | ICD-10-CM | POA: Insufficient documentation

## 2012-06-08 DIAGNOSIS — G8929 Other chronic pain: Secondary | ICD-10-CM | POA: Insufficient documentation

## 2012-06-08 DIAGNOSIS — M549 Dorsalgia, unspecified: Secondary | ICD-10-CM | POA: Insufficient documentation

## 2012-06-08 DIAGNOSIS — W208XXA Other cause of strike by thrown, projected or falling object, initial encounter: Secondary | ICD-10-CM | POA: Insufficient documentation

## 2012-06-08 DIAGNOSIS — S9030XA Contusion of unspecified foot, initial encounter: Secondary | ICD-10-CM | POA: Insufficient documentation

## 2012-06-08 DIAGNOSIS — Y939 Activity, unspecified: Secondary | ICD-10-CM | POA: Insufficient documentation

## 2012-06-08 DIAGNOSIS — S9031XA Contusion of right foot, initial encounter: Secondary | ICD-10-CM

## 2012-06-08 MED ORDER — IBUPROFEN 600 MG PO TABS: 600.0000 mg | ORAL_TABLET | Freq: Four times a day (QID) | ORAL | Status: DC | PRN

## 2012-06-08 MED ORDER — IBUPROFEN 800 MG PO TABS
800.0000 mg | ORAL_TABLET | Freq: Once | ORAL | Status: AC
  Administered 2012-06-08: 800 mg via ORAL
  Filled 2012-06-08: qty 1

## 2012-06-08 MED ORDER — HYDROCODONE-ACETAMINOPHEN 5-325 MG PO TABS
1.0000 | ORAL_TABLET | Freq: Once | ORAL | Status: AC
  Administered 2012-06-08: 1 via ORAL
  Filled 2012-06-08: qty 1

## 2012-06-08 MED ORDER — HYDROCODONE-ACETAMINOPHEN 5-325 MG PO TABS: 1.0000 | ORAL_TABLET | ORAL | Status: DC | PRN

## 2012-06-08 NOTE — ED Provider Notes (Signed)
Medical screening examination/treatment/procedure(s) were performed by non-physician practitioner and as supervising physician I was immediately available for consultation/collaboration. PA needs to finish chart   Ward Givens, MD 06/08/12 2341
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
## 2012-06-08 NOTE — ED Provider Notes (Signed)
History     CSN: [id number]  Arrival date & time 06/08/12  1905   First MD Initiated Contact with Patient 06/08/12 1946      Chief Complaint  Patient presents with  . Foot Injury    (Consider location/radiation/quality/duration/timing/severity/associated sxs/prior treatment) Patient is a 22 y.o. Warner presenting with foot injury. The history is provided by the patient.  Foot Injury Location:  Foot Time since incident:  1 hour Injury: yes   Mechanism of injury: crush   Crush injury:    Mechanism:  Falling object   Duration of crushing force:  1 second   Approximate weight of object:  10 pounds Foot location:  R foot Pain details:    Quality:  Aching and throbbing   Radiates to:  Does not radiate   Severity:  Severe   Onset quality:  Sudden   Timing:  Constant   Progression:  Unchanged Chronicity:  New Prior injury to area:  No Worsened by:  Bearing weight Ineffective treatments:  None tried Associated symptoms: swelling   Associated symptoms: no back pain, no fever, no numbness and no tingling     Past Medical History  Diagnosis Date  . Chronic back pain     Past Surgical History  Procedure Laterality Date  . Cesarean section    . Back surgery      History reviewed. No pertinent family history.  History  Substance Use Topics  . Smoking status: Never Smoker   . Smokeless tobacco: Not on file  . Alcohol Use: No    OB History   Grav Para Term Preterm Abortions TAB SAB Ect Mult Living                  Review of Systems  Constitutional: Negative for fever.  Musculoskeletal: Positive for arthralgias. Negative for myalgias, back pain and joint swelling.  Neurological: Negative for weakness and numbness.    Allergies  Review of patient's allergies indicates no known allergies.  Home Medications   Current Outpatient Rx  Name  Route  Sig  Dispense  Refill  . norgestimate-ethinyl estradiol (SPRINTEC 28) 0.25-35 MG-MCG tablet   Oral   Take 1  tablet by mouth every morning.          Marland Kitchen HYDROcodone-acetaminophen (NORCO/VICODIN) 5-325 MG per tablet   Oral   Take 1 tablet by mouth every 4 (four) hours as needed for pain.   12 tablet   0   . ibuprofen (ADVIL,MOTRIN) 600 MG tablet   Oral   Take 1 tablet (600 mg total) by mouth every 6 (six) hours as needed for pain.   30 tablet   0     BP 129/86  Pulse 87  Temp(Src) 98.8 F (37.1 C) (Oral)  Resp 20  Ht 5\' 10"  (1.778 m)  Wt 220 lb (99.791 kg)  BMI 31.57 kg/m2  SpO2 100%  LMP 06/07/2012  Physical Exam  Constitutional: She appears well-developed and well-nourished.  HENT:  Head: Atraumatic.  Neck: Normal range of motion.  Cardiovascular:  Pulses equal bilaterally  Musculoskeletal: She exhibits tenderness.       Right foot: She exhibits tenderness and swelling. She exhibits normal capillary refill, no crepitus and no deformity.  Large early ecchymotic area dorsal right foot.  Neurological: She is alert. She has normal strength. She displays normal reflexes. No sensory deficit.  Equal strength  Skin: Skin is warm and dry.  Psychiatric: She has a normal mood and affect.    ED  Course  Procedures (including critical care time)  Labs Reviewed - No data to display Dg Foot Complete Right  06/08/2012  *RADIOLOGY REPORT*  Clinical Data: Right foot injury and pain.  RIGHT FOOT COMPLETE - 3+ VIEW  Comparison:  None.  Findings:  There is no evidence of fracture or dislocation.  There is no evidence of arthropathy or other focal bone abnormality. Soft tissues are unremarkable.  IMPRESSION: Negative.   Original Report Authenticated By: Irish Lack, M.D.      1. Foot contusion, right, initial encounter       MDM  Patients labs and/or radiological studies were viewed and considered during the medical decision making and disposition process.  Patient was placed a Lenora Boys dressing.  She was prescribed a small quantity of hydrocodone for the next 2 days and  ibuprofen.  Encouraged ice and elevation as much as possible.  She has crutches at home and she was encouraged to use these when necessary.        Burgess Amor, PA-C 06/08/12 2109

## 2012-08-31 ENCOUNTER — Encounter (HOSPITAL_COMMUNITY): Payer: Self-pay | Admitting: *Deleted

## 2012-08-31 ENCOUNTER — Emergency Department (HOSPITAL_COMMUNITY)
Admission: EM | Admit: 2012-08-31 | Discharge: 2012-08-31 | Disposition: A | Discharge status: Home / Self Care | Payer: Medicaid Other | Attending: Emergency Medicine | Admitting: Emergency Medicine

## 2012-08-31 ENCOUNTER — Emergency Department (HOSPITAL_COMMUNITY): Payer: Medicaid Other

## 2012-08-31 DIAGNOSIS — S8990XA Unspecified injury of unspecified lower leg, initial encounter: Secondary | ICD-10-CM | POA: Insufficient documentation

## 2012-08-31 DIAGNOSIS — G8929 Other chronic pain: Secondary | ICD-10-CM | POA: Insufficient documentation

## 2012-08-31 DIAGNOSIS — S99929A Unspecified injury of unspecified foot, initial encounter: Secondary | ICD-10-CM | POA: Insufficient documentation

## 2012-08-31 DIAGNOSIS — X500XXA Overexertion from strenuous movement or load, initial encounter: Secondary | ICD-10-CM | POA: Insufficient documentation

## 2012-08-31 DIAGNOSIS — S99911A Unspecified injury of right ankle, initial encounter: Secondary | ICD-10-CM

## 2012-08-31 DIAGNOSIS — S9000XA Contusion of unspecified ankle, initial encounter: Secondary | ICD-10-CM | POA: Insufficient documentation

## 2012-08-31 DIAGNOSIS — Y9289 Other specified places as the place of occurrence of the external cause: Secondary | ICD-10-CM | POA: Insufficient documentation

## 2012-08-31 DIAGNOSIS — Y9301 Activity, walking, marching and hiking: Secondary | ICD-10-CM | POA: Insufficient documentation

## 2012-08-31 MED ORDER — NAPROXEN 500 MG PO TABS: 500.0000 mg | ORAL_TABLET | Freq: Two times a day (BID) | ORAL | Status: DC

## 2012-08-31 MED ORDER — OXYCODONE-ACETAMINOPHEN 5-325 MG PO TABS
1.0000 | ORAL_TABLET | Freq: Once | ORAL | Status: AC
  Administered 2012-08-31: 1 via ORAL
  Filled 2012-08-31: qty 1

## 2012-08-31 MED ORDER — HYDROCODONE-ACETAMINOPHEN 5-325 MG PO TABS: 1.0000 | ORAL_TABLET | ORAL | Status: DC | PRN

## 2012-08-31 NOTE — ED Provider Notes (Signed)
History    CSN: [id number] Arrival date & time 08/31/12  4098  First MD Initiated Contact with Patient 08/31/12 1931     Chief Complaint  Patient presents with  . Ankle Pain   (Consider location/radiation/quality/duration/timing/severity/associated sxs/prior Treatment) Patient is a 22 y.o. female presenting with ankle pain. The history is provided by the patient.  Ankle Pain Location:  Ankle Time since incident:  1 hour Injury: yes   Mechanism of injury comment:  Walking Ankle location:  R ankle Pain details:    Quality:  Shooting and throbbing   Radiates to:  Does not radiate   Severity:  Severe   Onset quality:  Sudden   Timing:  Constant   Progression:  Unchanged Chronicity:  New Dislocation: no   Foreign body present:  No foreign bodies Prior injury to area:  No Relieved by:  None tried Worsened by:  Bearing weight Ineffective treatments:  None tried Associated symptoms: decreased ROM and swelling   Associated symptoms: no fever, no numbness, no stiffness and no tingling    Past Medical History  Diagnosis Date  . Chronic back pain    Past Surgical History  Procedure Laterality Date  . Cesarean section    . Back surgery     History reviewed. No pertinent family history. History  Substance Use Topics  . Smoking status: Never Smoker   . Smokeless tobacco: Not on file  . Alcohol Use: No   OB History   Grav Para Term Preterm Abortions TAB SAB Ect Mult Living                 Review of Systems  Constitutional: Negative for fever and activity change.  Respiratory: Negative for shortness of breath.   Gastrointestinal: Negative for nausea and vomiting.  Musculoskeletal: Negative for stiffness.       Right ankle pain and swelling  Skin: Negative for wound.  Psychiatric/Behavioral: The patient is not nervous/anxious.     Allergies  Review of patient's allergies indicates no known allergies.  Home Medications   Current Outpatient Rx  Name  Route  Sig   Dispense  Refill  . norgestimate-ethinyl estradiol (SPRINTEC 28) 0.25-35 MG-MCG tablet   Oral   Take 1 tablet by mouth every morning.           BP 128/78  Pulse 69  Temp(Src) 98.2 F (36.8 C) (Oral)  Resp 18  Ht 5\' 9"  (1.753 m)  Wt 220 lb (99.791 kg)  BMI 32.47 kg/m2  SpO2 100%  LMP 08/24/2012 Physical Exam  Nursing note and vitals reviewed. Constitutional: She is oriented to person, place, and time. She appears well-developed and well-nourished. No distress.  HENT:  Head: Normocephalic and atraumatic.  Eyes: EOM are normal.  Neck: Neck supple.  Cardiovascular: Normal rate.   Pulmonary/Chest: Effort normal.  Musculoskeletal:       Right ankle: She exhibits decreased range of motion, swelling and ecchymosis. She exhibits no deformity, no laceration and normal pulse. Tenderness. Lateral malleolus tenderness found. Achilles tendon normal.       Feet:  Neurological: She is alert and oriented to person, place, and time. She has normal strength. No cranial nerve deficit or sensory deficit.  Pedal pulses equal, adequate circulation, good touch sensation.   Skin: Skin is warm and dry.  Psychiatric: She has a normal mood and affect. Her behavior is normal.    ED Course  Procedures (including critical care time) Labs Reviewed - No data to display Dg Ankle  Complete Right  08/31/2012   *RADIOLOGY REPORT*  Clinical Data: Right ankle pain after injury  RIGHT ANKLE - COMPLETE 3+ VIEW  Comparison: March 19, 2012.  Findings: No fracture or dislocation is noted.  Joint spaces are intact.  Soft tissue swelling is seen over lateral malleolus which may suggest underlying ligamentous injury.  IMPRESSION: No acute fracture or dislocation is seen in right ankle.   Original Report Authenticated By: Lupita Raider.,  M.D.   MDM  22 y.o. female with right lateral ankle pain, ecchymosis and swelling. No signs of compartment syndrome at this time. Will apply air cast, she will apply ice, elevate,  use crutches. Pain management and follow up with ortho.  I have reviewed this patient's vital signs, nurses notes, appropriate imaging.  I have discussed findings with the patient and plan of care and she voices understanding.    Medication List    TAKE these medications       HYDROcodone-acetaminophen 5-325 MG per tablet  Commonly known as:  NORCO/VICODIN  Take 1 tablet by mouth every 4 (four) hours as needed.     naproxen 500 MG tablet  Commonly known as:  NAPROSYN  Take 1 tablet (500 mg total) by mouth 2 (two) times daily.      ASK your doctor about these medications       SPRINTEC 28 0.25-35 MG-MCG tablet  Generic drug:  norgestimate-ethinyl estradiol  Take 1 tablet by mouth every morning.         [place], [place] 08/31/12 2101

## 2012-08-31 NOTE — ED Notes (Signed)
Pt walking up steps and right ankle twisted while going up steps, swelling and bruising noted to right ankle, pt did ambulate to room from waiting room with limp

## 2012-09-02 NOTE — ED Provider Notes (Signed)
Medical screening examination/treatment/procedure(s) were performed by non-physician practitioner and as supervising physician I was immediately available for consultation/collaboration.    Vida Roller, MD 09/02/12 [phone]

## 2012-11-05 ENCOUNTER — Emergency Department (HOSPITAL_COMMUNITY)
Admission: EM | Admit: 2012-11-05 | Discharge: 2012-11-05 | Disposition: A | Discharge status: Home / Self Care | Payer: Medicaid Other | Attending: Emergency Medicine | Admitting: Emergency Medicine

## 2012-11-05 ENCOUNTER — Encounter (HOSPITAL_COMMUNITY): Payer: Self-pay | Admitting: *Deleted

## 2012-11-05 ENCOUNTER — Emergency Department (HOSPITAL_COMMUNITY): Payer: Medicaid Other

## 2012-11-05 DIAGNOSIS — S93401A Sprain of unspecified ligament of right ankle, initial encounter: Secondary | ICD-10-CM

## 2012-11-05 DIAGNOSIS — W010XXA Fall on same level from slipping, tripping and stumbling without subsequent striking against object, initial encounter: Secondary | ICD-10-CM | POA: Insufficient documentation

## 2012-11-05 DIAGNOSIS — Y9289 Other specified places as the place of occurrence of the external cause: Secondary | ICD-10-CM | POA: Insufficient documentation

## 2012-11-05 DIAGNOSIS — Z79899 Other long term (current) drug therapy: Secondary | ICD-10-CM | POA: Insufficient documentation

## 2012-11-05 DIAGNOSIS — T22132A Burn of first degree of left upper arm, initial encounter: Secondary | ICD-10-CM

## 2012-11-05 DIAGNOSIS — T22139A Burn of first degree of unspecified upper arm, initial encounter: Secondary | ICD-10-CM | POA: Insufficient documentation

## 2012-11-05 DIAGNOSIS — Y93G9 Activity, other involving cooking and grilling: Secondary | ICD-10-CM | POA: Insufficient documentation

## 2012-11-05 DIAGNOSIS — X500XXA Overexertion from strenuous movement or load, initial encounter: Secondary | ICD-10-CM | POA: Insufficient documentation

## 2012-11-05 DIAGNOSIS — G8929 Other chronic pain: Secondary | ICD-10-CM | POA: Insufficient documentation

## 2012-11-05 DIAGNOSIS — S93409A Sprain of unspecified ligament of unspecified ankle, initial encounter: Secondary | ICD-10-CM | POA: Insufficient documentation

## 2012-11-05 MED ORDER — SILVER SULFADIAZINE 1 % EX CREA
TOPICAL_CREAM | Freq: Once | CUTANEOUS | Status: AC
  Administered 2012-11-05: 18:00:00 via TOPICAL
  Filled 2012-11-05: qty 50

## 2012-11-05 MED ORDER — HYDROCODONE-ACETAMINOPHEN 5-325 MG PO TABS: 1.0000 | ORAL_TABLET | ORAL | Status: DC | PRN

## 2012-11-05 MED ORDER — TETANUS-DIPHTH-ACELL PERTUSSIS 5-2.5-18.5 LF-MCG/0.5 IM SUSP: 0.5000 mL | Freq: Once | INTRAMUSCULAR | Status: DC
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
## 2012-11-06 NOTE — ED Provider Notes (Signed)
Medical screening examination/treatment/procedure(s) were performed by non-physician practitioner and as supervising physician I was immediately available for consultation/collaboration.   Larsen Zettel M Earnestene Angello, MD 11/06/12 1705 

## 2012-11-27 ENCOUNTER — Emergency Department (HOSPITAL_COMMUNITY)
Admission: EM | Admit: 2012-11-27 | Discharge: 2012-11-27 | Discharge status: Against Medical Advice | Payer: Medicaid Other | Attending: Emergency Medicine | Admitting: Emergency Medicine

## 2012-11-27 ENCOUNTER — Encounter (HOSPITAL_COMMUNITY): Payer: Self-pay | Admitting: Emergency Medicine

## 2012-11-27 DIAGNOSIS — G8918 Other acute postprocedural pain: Secondary | ICD-10-CM | POA: Insufficient documentation

## 2012-11-27 DIAGNOSIS — R109 Unspecified abdominal pain: Secondary | ICD-10-CM | POA: Insufficient documentation

## 2012-11-27 NOTE — ED Notes (Signed)
Called for room x1. No answer. 

## 2012-11-27 NOTE — ED Notes (Signed)
Patient complaining of lower abdominal pain to area where she had cesarean section 1 year ago. Denies nausea, vomiting, diarrhea, or other symptoms.

## 2012-12-14 ENCOUNTER — Ambulatory Visit: Payer: Medicaid Other | Attending: *Deleted | Admitting: Physical Therapy

## 2012-12-14 DIAGNOSIS — R5381 Other malaise: Secondary | ICD-10-CM | POA: Insufficient documentation

## 2012-12-14 DIAGNOSIS — M545 Low back pain, unspecified: Secondary | ICD-10-CM | POA: Insufficient documentation

## 2012-12-14 DIAGNOSIS — IMO0001 Reserved for inherently not codable concepts without codable children: Secondary | ICD-10-CM | POA: Insufficient documentation

## 2013-03-12 ENCOUNTER — Encounter (HOSPITAL_COMMUNITY): Payer: Self-pay | Admitting: Emergency Medicine

## 2013-03-12 ENCOUNTER — Emergency Department (HOSPITAL_COMMUNITY)
Admission: EM | Admit: 2013-03-12 | Discharge: 2013-03-12 | Disposition: A | Discharge status: Home / Self Care | Payer: Medicaid Other | Attending: Emergency Medicine | Admitting: Emergency Medicine

## 2013-03-12 ENCOUNTER — Emergency Department (HOSPITAL_COMMUNITY): Payer: Medicaid Other

## 2013-03-12 DIAGNOSIS — G8929 Other chronic pain: Secondary | ICD-10-CM | POA: Insufficient documentation

## 2013-03-12 DIAGNOSIS — Z9889 Other specified postprocedural states: Secondary | ICD-10-CM | POA: Insufficient documentation

## 2013-03-12 DIAGNOSIS — S39012A Strain of muscle, fascia and tendon of lower back, initial encounter: Secondary | ICD-10-CM

## 2013-03-12 DIAGNOSIS — Z3202 Encounter for pregnancy test, result negative: Secondary | ICD-10-CM | POA: Insufficient documentation

## 2013-03-12 DIAGNOSIS — W1809XA Striking against other object with subsequent fall, initial encounter: Secondary | ICD-10-CM | POA: Insufficient documentation

## 2013-03-12 DIAGNOSIS — S335XXA Sprain of ligaments of lumbar spine, initial encounter: Secondary | ICD-10-CM | POA: Insufficient documentation

## 2013-03-12 DIAGNOSIS — Y9389 Activity, other specified: Secondary | ICD-10-CM | POA: Insufficient documentation

## 2013-03-12 DIAGNOSIS — Y929 Unspecified place or not applicable: Secondary | ICD-10-CM | POA: Insufficient documentation

## 2013-03-12 LAB — POCT PREGNANCY, URINE: Preg Test, Ur: NEGATIVE

## 2013-03-12 MED ORDER — METHOCARBAMOL 500 MG PO TABS: 500.0000 mg | ORAL_TABLET | Freq: Three times a day (TID) | ORAL | Status: DC

## 2013-03-12 MED ORDER — KETOROLAC TROMETHAMINE 10 MG PO TABS
10.0000 mg | ORAL_TABLET | Freq: Once | ORAL | Status: AC
  Administered 2013-03-12: 10 mg via ORAL
  Filled 2013-03-12: qty 1

## 2013-03-12 MED ORDER — DIAZEPAM 5 MG PO TABS
5.0000 mg | ORAL_TABLET | Freq: Once | ORAL | Status: AC
  Administered 2013-03-12: 5 mg via ORAL
  Filled 2013-03-12: qty 1

## 2013-03-12 MED ORDER — HYDROCODONE-ACETAMINOPHEN 5-325 MG PO TABS: 1.0000 | ORAL_TABLET | ORAL | Status: DC | PRN

## 2013-03-12 MED ORDER — DEXAMETHASONE 6 MG PO TABS: ORAL_TABLET | ORAL | Status: DC

## 2013-03-12 MED ORDER — PREDNISONE 50 MG PO TABS
60.0000 mg | ORAL_TABLET | Freq: Once | ORAL | Status: AC
  Administered 2013-03-12: 18:00:00 60 mg via ORAL
  Filled 2013-03-12 (×2): qty 1

## 2013-03-12 NOTE — ED Notes (Signed)
Pt requesting pain med for back

## 2013-03-12 NOTE — ED Notes (Signed)
Letting dog out this am and tripped hitting lower back on step. Denies hitting head or loc. Ambulated to room without difficulty.

## 2013-03-12 NOTE — Discharge Instructions (Signed)
Lumbosacral Strain  [facility] FOR ADDITIONAL EVALUATION AND MANAGEMENT OF YOUR BACK PAIN. NORCO AND ROBAXIN MAY CAUSE DROWSINESS, USE WITH CAUTION.                                                                                                                   Lumbosacral strain is a strain of any of the parts that make up your lumbosacral vertebrae. Your lumbosacral vertebrae are the bones that make up the lower third of your backbone. Your lumbosacral vertebrae are held together by muscles and tough, fibrous tissue (ligaments).  CAUSES  A sudden blow to your back can cause lumbosacral strain. Also, anything that causes an excessive stretch of the muscles in the low back can cause this strain. This is typically seen when people exert themselves strenuously, fall, lift heavy objects, bend, or crouch repeatedly. RISK FACTORS  Physically demanding work.  Participation in pushing or pulling sports or sports that require sudden twist of the back (tennis, golf, baseball).  Weight lifting.  Excessive lower back curvature.  Forward-tilted pelvis.  Weak back or abdominal muscles or both.  Tight hamstrings. SIGNS AND SYMPTOMS  Lumbosacral strain may cause pain in the area of your injury or pain that moves (radiates) down your leg.  DIAGNOSIS Your health care provider can often diagnose lumbosacral strain through a physical exam. In some cases, you may need tests such as X-ray exams.  TREATMENT  Treatment for your lower back injury depends on many factors that your clinician will have to evaluate. However, most treatment will include the use of anti-inflammatory medicines. HOME CARE INSTRUCTIONS   Avoid hard physical activities (tennis, racquetball, waterskiing) if you are not in proper physical condition for it. This may aggravate or create problems.  If you have a back problem, avoid sports requiring sudden body movements. Swimming and walking are generally safer  activities.  Maintain good posture.  Maintain a healthy weight.  For acute conditions, you may put ice on the injured area.  Put ice in a plastic bag.  Place a towel between your skin and the bag.  Leave the ice on for 20 minutes, 2 3 times a day.  When the low back starts healing, stretching and strengthening exercises may be recommended. SEEK MEDICAL CARE IF:  Your back pain is getting worse.  You experience severe back pain not relieved with medicines. SEEK IMMEDIATE MEDICAL CARE IF:   You have numbness, tingling, weakness, or problems with the use of your arms or legs.  There is a change in bowel or bladder control.  You have increasing pain in any area of the body, including your belly (abdomen).  You notice shortness of breath, dizziness, or feel faint.  You feel sick to your stomach (nauseous), are throwing up (vomiting), or become sweaty.  You notice discoloration of your toes or legs, or your feet get very cold. MAKE SURE YOU:   Understand these instructions.  Will watch your condition.  Will get help right  away if you are not doing well or get worse. Document Released: 11/11/2004 Document Revised: 11/22/2012 Document Reviewed: 09/20/2012 [facility] Patient Information 2014 [employer], [place].

## 2013-03-12 NOTE — ED Provider Notes (Signed)
Medical screening examination/treatment/procedure(s) were performed by non-physician practitioner and as supervising physician I was immediately available for consultation/collaboration.  EKG Interpretation   None         Benny LennertJoseph L Jones Viviani, MD 03/12/13 2027

## 2013-03-12 NOTE — ED Notes (Signed)
Back pain , fell on steps when taking dog out today.  Contusion present to upper upperlumbar/ lower t spine area,  No HI, alert, MAE

## 2013-03-12 NOTE — ED Provider Notes (Signed)
CSN: [id number]     Arrival date & time 03/12/13  1222 History   First MD Initiated Contact with Patient 03/12/13 1535     Chief Complaint  Patient presents with  . Back Pain   (Consider location/radiation/quality/duration/timing/severity/associated sxs/prior Treatment) HPI Comments: Pt has had back surgery for a cyst on the back. No other surgeries.  Patient is a 23 y.o. female presenting with back pain. The history is provided by the patient.  Back Pain Location:  Lumbar spine Quality:  Aching Pain severity:  Moderate Pain is:  Same all the time Onset quality:  Sudden Duration:  7 hours Timing:  Constant Progression:  Worsening Chronicity:  New Context: falling   Relieved by:  Nothing Worsened by:  Movement Ineffective treatments:  None tried Associated symptoms: no abdominal pain, no bladder incontinence, no bowel incontinence, no chest pain, no dysuria and no perianal numbness     Past Medical History  Diagnosis Date  . Chronic back pain    Past Surgical History  Procedure Laterality Date  . Cesarean section    . Back surgery     No family history on file. History  Substance Use Topics  . Smoking status: Never Smoker   . Smokeless tobacco: Not on file  . Alcohol Use: No   OB History   Grav Para Term Preterm Abortions TAB SAB Ect Mult Living                 Review of Systems  Constitutional: Negative for activity change.       All ROS Neg except as noted in HPI  HENT: Negative for nosebleeds.   Eyes: Negative for photophobia and discharge.  Respiratory: Negative for cough, shortness of breath and wheezing.   Cardiovascular: Negative for chest pain and palpitations.  Gastrointestinal: Negative for abdominal pain, blood in stool and bowel incontinence.  Genitourinary: Negative for bladder incontinence, dysuria, frequency and hematuria.  Musculoskeletal: Positive for back pain. Negative for arthralgias and neck pain.  Skin: Negative.   Neurological:  Negative for dizziness, seizures and speech difficulty.  Psychiatric/Behavioral: Negative for hallucinations and confusion.    Allergies  Review of patient's allergies indicates no known allergies.  Home Medications   Current Outpatient Rx  Name  Route  Sig  Dispense  Refill  . norgestimate-ethinyl estradiol (SPRINTEC 28) 0.25-35 MG-MCG tablet   Oral   Take 1 tablet by mouth every morning.           BP 134/69  Pulse 75  Temp(Src) 98 F (36.7 C) (Oral)  Resp 16  SpO2 100%  LMP 02/19/2013 Physical Exam  Nursing note and vitals reviewed. Constitutional: She is oriented to person, place, and time. She appears well-developed and well-nourished.  Non-toxic appearance.  HENT:  Head: Normocephalic.  Right Ear: Tympanic membrane and external ear normal.  Left Ear: Tympanic membrane and external ear normal.  Eyes: EOM and lids are normal. Pupils are equal, round, and reactive to light.  Neck: Normal range of motion. Neck supple. Carotid bruit is not present.  Cardiovascular: Normal rate, regular rhythm, normal heart sounds, intact distal pulses and normal pulses.   Pulmonary/Chest: Breath sounds normal. No respiratory distress.  Abdominal: Soft. Bowel sounds are normal. There is no tenderness. There is no guarding.  Musculoskeletal: Normal range of motion.  Is paraspinal area tenderness of the lumbar region. There is no palpable step off. No hot areas appreciated.  Lymphadenopathy:       Head (right side): No submandibular adenopathy  present.       Head (left side): No submandibular adenopathy present.    She has no cervical adenopathy.  Neurological: She is alert and oriented to person, place, and time. She has normal strength. No cranial nerve deficit or sensory deficit. She exhibits normal muscle tone. Coordination normal.  No motor sensory deficits appreciated. Gait is steady. Patient walks with a limp. No foot drop noted.  Skin: Skin is warm and dry.  Psychiatric: She has a  normal mood and affect. Her speech is normal.    ED Course  Procedures (including critical care time) Labs Review Labs Reviewed - No data to display Imaging Review No results found.  EKG Interpretation   None       MDM  No diagnosis found. **I have reviewed nursing notes, vital signs, and all appropriate lab and imaging results for this patient.* Informed by radiology that they are having technical problems. Pt ambulatory with minimal problem. Rx for norco, decadron, robaxin given to the patient. Pt to follow up with her pcp.   Kathie DikeHobson M Tianne Plott, PA-C 03/12/13 1825

## 2013-12-08 ENCOUNTER — Emergency Department (HOSPITAL_COMMUNITY)
Admission: EM | Admit: 2013-12-08 | Discharge: 2013-12-08 | Disposition: A | Discharge status: Home / Self Care | Payer: Medicaid Other | Attending: Emergency Medicine | Admitting: Emergency Medicine

## 2013-12-08 ENCOUNTER — Encounter (HOSPITAL_COMMUNITY): Payer: Self-pay | Admitting: Emergency Medicine

## 2013-12-08 ENCOUNTER — Emergency Department (HOSPITAL_COMMUNITY): Payer: Medicaid Other

## 2013-12-08 DIAGNOSIS — S8991XA Unspecified injury of right lower leg, initial encounter: Secondary | ICD-10-CM | POA: Diagnosis not present

## 2013-12-08 DIAGNOSIS — M25572 Pain in left ankle and joints of left foot: Secondary | ICD-10-CM

## 2013-12-08 DIAGNOSIS — S199XXA Unspecified injury of neck, initial encounter: Secondary | ICD-10-CM | POA: Insufficient documentation

## 2013-12-08 DIAGNOSIS — Z791 Long term (current) use of non-steroidal anti-inflammatories (NSAID): Secondary | ICD-10-CM | POA: Diagnosis not present

## 2013-12-08 DIAGNOSIS — Z91040 Latex allergy status: Secondary | ICD-10-CM | POA: Insufficient documentation

## 2013-12-08 DIAGNOSIS — G8929 Other chronic pain: Secondary | ICD-10-CM | POA: Insufficient documentation

## 2013-12-08 DIAGNOSIS — S99912A Unspecified injury of left ankle, initial encounter: Secondary | ICD-10-CM | POA: Insufficient documentation

## 2013-12-08 DIAGNOSIS — S0083XA Contusion of other part of head, initial encounter: Secondary | ICD-10-CM | POA: Insufficient documentation

## 2013-12-08 DIAGNOSIS — R519 Headache, unspecified: Secondary | ICD-10-CM

## 2013-12-08 DIAGNOSIS — R51 Headache: Secondary | ICD-10-CM

## 2013-12-08 DIAGNOSIS — M898X6 Other specified disorders of bone, lower leg: Secondary | ICD-10-CM

## 2013-12-08 MED ORDER — LORAZEPAM 1 MG PO TABS
1.0000 mg | ORAL_TABLET | Freq: Once | ORAL | Status: AC
  Administered 2013-12-08: 1 mg via ORAL
  Filled 2013-12-08: qty 1

## 2013-12-08 MED ORDER — IBUPROFEN 800 MG PO TABS
800.0000 mg | ORAL_TABLET | Freq: Once | ORAL | Status: AC
  Administered 2013-12-08: 800 mg via ORAL
  Filled 2013-12-08: qty 1

## 2013-12-08 MED ORDER — NAPROXEN 500 MG PO TABS: 500.0000 mg | ORAL_TABLET | Freq: Two times a day (BID) | ORAL | Status: DC

## 2013-12-08 MED ORDER — CYCLOBENZAPRINE HCL 10 MG PO TABS: 10.0000 mg | ORAL_TABLET | Freq: Two times a day (BID) | ORAL | Status: DC | PRN
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
## 2013-12-08 NOTE — Discharge Instructions (Signed)
X-rays were all normal. You will be sore for several days. Ice.  Medication for pain and muscle spasm.

## 2014-09-14 ENCOUNTER — Emergency Department (HOSPITAL_COMMUNITY)
Admission: EM | Admit: 2014-09-14 | Discharge: 2014-09-14 | Disposition: A | Discharge status: Home / Self Care | Payer: Medicaid Other | Attending: Emergency Medicine | Admitting: Emergency Medicine

## 2014-09-14 ENCOUNTER — Encounter (HOSPITAL_COMMUNITY): Payer: Self-pay | Admitting: Emergency Medicine

## 2014-09-14 DIAGNOSIS — G8929 Other chronic pain: Secondary | ICD-10-CM | POA: Insufficient documentation

## 2014-09-14 DIAGNOSIS — L0291 Cutaneous abscess, unspecified: Secondary | ICD-10-CM

## 2014-09-14 DIAGNOSIS — L02413 Cutaneous abscess of right upper limb: Secondary | ICD-10-CM | POA: Diagnosis present

## 2014-09-14 DIAGNOSIS — L03113 Cellulitis of right upper limb: Secondary | ICD-10-CM | POA: Diagnosis not present

## 2014-09-14 DIAGNOSIS — L0390 Cellulitis, unspecified: Secondary | ICD-10-CM

## 2014-09-14 MED ORDER — CLINDAMYCIN PHOSPHATE 600 MG/50ML IV SOLN
600.0000 mg | Freq: Once | INTRAVENOUS | Status: AC
  Administered 2014-09-14: 600 mg via INTRAVENOUS
  Filled 2014-09-14: qty 50

## 2014-09-14 MED ORDER — CLINDAMYCIN HCL 150 MG PO CAPS: 450.0000 mg | ORAL_CAPSULE | Freq: Four times a day (QID) | ORAL | Status: DC

## 2014-09-14 MED ORDER — LIDOCAINE-EPINEPHRINE 2 %-1:100000 IJ SOLN
20.0000 mL | Freq: Once | INTRAMUSCULAR | Status: AC
  Administered 2014-09-14: 20 mL via INTRADERMAL
  Filled 2014-09-14: qty 1

## 2014-09-14 NOTE — ED Notes (Signed)
Pt had no adverse reaction to ABT. 

## 2014-09-14 NOTE — ED Provider Notes (Signed)
CSN: [id number]     Arrival date & time 09/14/14  1245 History   First MD Initiated Contact with Patient 09/14/14 1247     Chief Complaint  Patient presents with  . Arm Injury     (Consider location/radiation/quality/duration/timing/severity/associated sxs/prior Treatment) Patient is a 24 y.o. female presenting with abscess. The history is provided by the patient.  Abscess Location:  Shoulder/arm Shoulder/arm abscess location:  R wrist Size:  3cm Abscess quality: draining, fluctuance, induration, painful, redness and warmth   Duration:  2 days Progression:  Worsening Pain details:    Quality:  Aching and pressure   Severity:  Moderate   Duration:  2 days   Timing:  Constant   Progression:  Worsening Chronicity:  New Context comment:  Multiple IV attempts at OSH stay Relieved by:  Nothing Worsened by:  Nothing tried Ineffective treatments:  None tried Associated symptoms: no anorexia, no fever, no nausea and no vomiting     Past Medical History  Diagnosis Date  . Chronic back pain    Past Surgical History  Procedure Laterality Date  . Cesarean section    . Back surgery     History reviewed. No pertinent family history. History  Substance Use Topics  . Smoking status: Never Smoker   . Smokeless tobacco: Not on file  . Alcohol Use: No   OB History    No data available     Review of Systems  Constitutional: Negative for fever.  Gastrointestinal: Negative for nausea, vomiting and anorexia.  All other systems reviewed and are negative.     Allergies  Review of patient's allergies indicates no active allergies.  Home Medications   Prior to Admission medications   Medication Sig Start Date End Date Taking? Authorizing Provider  ibuprofen (ADVIL,MOTRIN) 800 MG tablet Take 800 mg by mouth every 4 (four) hours as needed.   Yes Historical Provider, MD  oxyCODONE-acetaminophen (PERCOCET/ROXICET) 5-325 MG per tablet Take 1 tablet by mouth every 4 (four) hours as  needed for severe pain.   Yes Historical Provider, MD  clindamycin (CLEOCIN) 150 MG capsule Take 3 capsules (450 mg total) by mouth 4 (four) times daily. 09/14/14   Lyndal Pulley, MD  cyclobenzaprine (FLEXERIL) 10 MG tablet Take 1 tablet (10 mg total) by mouth 2 (two) times daily as needed for muscle spasms. Patient not taking: Reported on 09/14/2014 12/08/13   Donnetta Hutching, MD  naproxen (NAPROSYN) 500 MG tablet Take 1 tablet (500 mg total) by mouth 2 (two) times daily. Patient not taking: Reported on 09/14/2014 12/08/13   Donnetta Hutching, MD   BP 145/80 mmHg  Pulse 77  Temp(Src) 97.9 F (36.6 C) (Oral)  Resp 18  SpO2 98% Physical Exam  Constitutional: She is oriented to person, place, and time. She appears well-developed and well-nourished. No distress.  HENT:  Head: Normocephalic.  Eyes: Conjunctivae are normal.  Neck: Neck supple. No tracheal deviation present.  Cardiovascular: Normal rate and regular rhythm.   Pulmonary/Chest: Effort normal. No respiratory distress.  Abdominal: Soft. She exhibits no distension.  Musculoskeletal:       Right wrist: She exhibits tenderness and swelling (with fluctuant pocket over dorsal radial wrist with extension of erythema and pitting edema).  Neurological: She is alert and oriented to person, place, and time.  Skin: Skin is warm and dry.  Psychiatric: She has a normal mood and affect.        ED Course  Procedures (including critical care time) INCISION AND DRAINAGE Performed by:  Lyndal Pulley Consent: Verbal consent obtained. Risks and benefits: risks, benefits and alternatives were discussed Type: abscess  Body area: right dorsal forearm  Anesthesia: local infiltration  Incision was made with a scalpel.  Local anesthetic: lidocaine 2% w epinephrine  Anesthetic total: 8 ml  Complexity: complex Blunt dissection to break up loculations  Drainage: purulent  Drainage amount: 30 cc  Packing material: 1/4 in iodoform gauze  Patient  tolerance: Patient tolerated the procedure well with no immediate complications.   Emergency Focused Ultrasound Exam Limited Ultrasound of Soft Tissue   Performed and interpreted by Dr. Clydene Pugh Indication: evaluation for infection or foreign body Transverse and Sagittal views of right upper extremity are obtained in real time for the purposes of evaluation of skin and underlying soft tissues.  Findings: + heterogeneous fluid collection, + hyperemia/edema of surrounding tissue Interpretation: + abscess, + cellulitis Images archived electronically.  CPT Codes:  Upper extremity [id number]  Other soft tissue 16109-60   Labs Review Labs Reviewed - No data to display  Imaging Review No results found.   EKG Interpretation None      MDM   Final diagnoses:  Abscess and cellulitis    24 year old female presents with evidence of cutaneous abscess to right wrist following multiple IV attempts and extravasation into forearm during recent admission to the hospital. She has evidence of extension of cellulitis from the site. She does not have any systemic symptoms or evidence of joint involvement currently.  Incision and drainage performed as documented above and single dose of IV antibiotics given in ED. Clindamycin prescribed for outpatient management of overlying cellulitis and recommended close follow-up tomorrow with her primary care physician for recheck and serial cell phone pictures to help follow progression of illness. Return precautions discussed for fever, signs of extension, or other complications.    Lyndal Pulley, MD 09/14/14 [phone]

## 2014-09-14 NOTE — ED Notes (Signed)
Dr. Clydene Pugh at bedside to I&D wound. Pt tolerating well.

## 2014-09-14 NOTE — ED Notes (Signed)
Pt presented with swelling, redness, warm to touch, painful to touch and darken center to rt wrist area s/p to IV infiltration per pt. Noted serous drainage.

## 2014-09-14 NOTE — ED Notes (Signed)
Awake. Verbally responsive. A/O x4. Resp even and unlabored. No audible adventitious breath sounds noted. ABC's intact. IV saline lock patent and intact. Family at bedside. 

## 2014-09-14 NOTE — Discharge Instructions (Signed)
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
## 2014-09-14 NOTE — ED Notes (Signed)
Awake. Verbally responsive. A/O x4. Resp even and unlabored. No audible adventitious breath sounds noted. ABC's intact. Saline lock patent and intact. Applied dry dsg to wound. Pt tolerated well.
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
## 2014-10-29 ENCOUNTER — Emergency Department (HOSPITAL_COMMUNITY)
Admission: EM | Admit: 2014-10-29 | Discharge: 2014-10-29 | Disposition: A | Discharge status: Home / Self Care | Payer: Medicaid Other | Attending: Emergency Medicine | Admitting: Emergency Medicine

## 2014-10-29 ENCOUNTER — Encounter (HOSPITAL_COMMUNITY): Payer: Self-pay | Admitting: Emergency Medicine

## 2014-10-29 DIAGNOSIS — N99820 Postprocedural hemorrhage and hematoma of a genitourinary system organ or structure following a genitourinary system procedure: Secondary | ICD-10-CM | POA: Insufficient documentation

## 2014-10-29 DIAGNOSIS — Z792 Long term (current) use of antibiotics: Secondary | ICD-10-CM | POA: Diagnosis not present

## 2014-10-29 DIAGNOSIS — Z9851 Tubal ligation status: Secondary | ICD-10-CM | POA: Insufficient documentation

## 2014-10-29 DIAGNOSIS — G8929 Other chronic pain: Secondary | ICD-10-CM | POA: Diagnosis not present

## 2014-10-29 DIAGNOSIS — O902 Hematoma of obstetric wound: Secondary | ICD-10-CM

## 2014-10-29 LAB — CBC WITH DIFFERENTIAL/PLATELET
BASOS ABS: 0 10*3/uL (ref 0.0–0.1)
Basophils Relative: 0 % (ref 0–1)
EOS PCT: 1 % (ref 0–5)
Eosinophils Absolute: 0.1 10*3/uL (ref 0.0–0.7)
HCT: 35.9 % — ABNORMAL LOW (ref 36.0–46.0)
Hemoglobin: 12.1 g/dL (ref 12.0–15.0)
LYMPHS ABS: 3.1 10*3/uL (ref 0.7–4.0)
Lymphocytes Relative: 46 % (ref 12–46)
MCH: 30.1 pg (ref 26.0–34.0)
MCHC: 33.7 g/dL (ref 30.0–36.0)
MCV: 89.3 fL (ref 78.0–100.0)
Monocytes Absolute: 0.4 10*3/uL (ref 0.1–1.0)
Monocytes Relative: 5 % (ref 3–12)
NEUTROS ABS: 3.2 10*3/uL (ref 1.7–7.7)
Neutrophils Relative %: 48 % (ref 43–77)
PLATELETS: 174 10*3/uL (ref 150–400)
RBC: 4.02 MIL/uL (ref 3.87–5.11)
RDW: 12.6 % (ref 11.5–15.5)
WBC: 6.7 10*3/uL (ref 4.0–10.5)

## 2014-10-29 LAB — COMPREHENSIVE METABOLIC PANEL
ALK PHOS: 45 U/L (ref 38–126)
ALT: 14 U/L (ref 14–54)
AST: 17 U/L (ref 15–41)
Albumin: 3.8 g/dL (ref 3.5–5.0)
Anion gap: 3 — ABNORMAL LOW (ref 5–15)
BUN: 10 mg/dL (ref 6–20)
CALCIUM: 8.3 mg/dL — AB (ref 8.9–10.3)
CHLORIDE: 108 mmol/L (ref 101–111)
CO2: 24 mmol/L (ref 22–32)
CREATININE: 0.58 mg/dL (ref 0.44–1.00)
GFR calc Af Amer: >60 mL/min (ref >60)
Glucose, Bld: 93 mg/dL (ref 65–99)
Potassium: 3.4 mmol/L — ABNORMAL LOW (ref 3.5–5.1)
Sodium: 135 mmol/L (ref 135–145)
Total Bilirubin: 0.5 mg/dL (ref 0.3–1.2)
Total Protein: 6.5 g/dL (ref 6.5–8.1)

## 2014-10-29 LAB — URINALYSIS, ROUTINE W REFLEX MICROSCOPIC
BILIRUBIN URINE: NEGATIVE
Glucose, UA: NEGATIVE mg/dL
Ketones, ur: NEGATIVE mg/dL
Leukocytes, UA: NEGATIVE
Nitrite: NEGATIVE
Protein, ur: NEGATIVE mg/dL
SPECIFIC GRAVITY, URINE: 1.025 (ref 1.005–1.030)
Urobilinogen, UA: 0.2 mg/dL (ref 0.0–1.0)
pH: 7 (ref 5.0–8.0)

## 2014-10-29 LAB — URINE MICROSCOPIC-ADD ON

## 2014-10-29 MED ORDER — CEPHALEXIN 500 MG PO CAPS
500.0000 mg | ORAL_CAPSULE | Freq: Once | ORAL | Status: AC
  Administered 2014-10-29: 500 mg via ORAL
  Filled 2014-10-29: qty 1

## 2014-10-29 MED ORDER — CEPHALEXIN 500 MG PO CAPS: 500.0000 mg | ORAL_CAPSULE | Freq: Three times a day (TID) | ORAL | Status: DC

## 2014-10-29 MED ORDER — HYDROCODONE-ACETAMINOPHEN 5-325 MG PO TABS: 2.0000 | ORAL_TABLET | ORAL | Status: DC | PRN

## 2014-10-29 MED ORDER — HYDROCODONE-ACETAMINOPHEN 5-325 MG PO TABS
1.0000 | ORAL_TABLET | Freq: Once | ORAL | Status: AC
  Administered 2014-10-29: 1 via ORAL
  Filled 2014-10-29: qty 1

## 2014-10-29 NOTE — ED Notes (Addendum)
Pt states that she has been having problems with bleeding from her tubal incision site at umbilicus and also from lower abdo site- since she had surgery last week

## 2014-10-29 NOTE — ED Provider Notes (Signed)
CSN: [id number]     Arrival date & time 10/29/14  1431 History   First MD Initiated Contact with Patient 10/29/14 1503     Chief Complaint  Patient presents with  . Post-op Problem      HPI  Patient position evaluation of bleeding from a surgical incision from a BTL this performed 6 days ago. Performed by GYN surgeon in [place]. She states it was raining today and it looked like "dark". She states it has been painful. Also admits that she "ran out of the pain medicine" given to her by her surgeon.  Past Medical History  Diagnosis Date  . Chronic back pain    Past Surgical History  Procedure Laterality Date  . Cesarean section    . Back surgery    . Tubal ligation     History reviewed. No pertinent family history. Social History  Substance Use Topics  . Smoking status: Never Smoker   . Smokeless tobacco: None  . Alcohol Use: No   OB History    No data available     Review of Systems  Constitutional: Negative for fever, chills, diaphoresis, appetite change and fatigue.  HENT: Negative for mouth sores, sore throat and trouble swallowing.   Eyes: Negative for visual disturbance.  Respiratory: Negative for cough, chest tightness, shortness of breath and wheezing.   Cardiovascular: Negative for chest pain.  Gastrointestinal: Negative for nausea, vomiting, abdominal pain, diarrhea and abdominal distention.       Drainage from periumbilical wound.  Endocrine: Negative for polydipsia, polyphagia and polyuria.  Genitourinary: Negative for dysuria, frequency and hematuria.  Musculoskeletal: Negative for gait problem.  Skin: Negative for color change, pallor and rash.  Neurological: Negative for dizziness, syncope, light-headedness and headaches.  Hematological: Does not bruise/bleed easily.  Psychiatric/Behavioral: Negative for behavioral problems and confusion.      Allergies  Review of patient's allergies indicates no known allergies.  Home Medications   Prior to  Admission medications   Medication Sig Start Date End Date Taking? Authorizing Provider  cephALEXin (KEFLEX) 500 MG capsule Take 1 capsule (500 mg total) by mouth 3 (three) times daily. 10/29/14   Rolland Porter, MD  clindamycin (CLEOCIN) 150 MG capsule Take 3 capsules (450 mg total) by mouth 4 (four) times daily. 09/14/14   Lyndal Pulley, MD  cyclobenzaprine (FLEXERIL) 10 MG tablet Take 1 tablet (10 mg total) by mouth 2 (two) times daily as needed for muscle spasms. Patient not taking: Reported on 09/14/2014 12/08/13   Donnetta Hutching, MD  HYDROcodone-acetaminophen (NORCO/VICODIN) 5-325 MG per tablet Take 2 tablets by mouth every 4 (four) hours as needed. 10/29/14   Rolland Porter, MD  ibuprofen (ADVIL,MOTRIN) 800 MG tablet Take 800 mg by mouth every 4 (four) hours as needed.    Historical Provider, MD  naproxen (NAPROSYN) 500 MG tablet Take 1 tablet (500 mg total) by mouth 2 (two) times daily. Patient not taking: Reported on 09/14/2014 12/08/13   Donnetta Hutching, MD  oxyCODONE-acetaminophen (PERCOCET/ROXICET) 5-325 MG per tablet Take 1 tablet by mouth every 4 (four) hours as needed for severe pain.    Historical Provider, MD   BP 121/84 mmHg  Pulse 64  Temp(Src) 97.6 F (36.4 C) (Oral)  Resp 13  Ht 5\' 9"  (1.753 m)  Wt 260 lb (117.935 kg)  BMI 38.38 kg/m2  SpO2 99% Physical Exam  Constitutional: She is oriented to person, place, and time. She appears well-developed and well-nourished. No distress.  HENT:  Head: Normocephalic.  Eyes: Conjunctivae  are normal. Pupils are equal, round, and reactive to light. No scleral icterus.  Neck: Normal range of motion. Neck supple. No thyromegaly present.  Cardiovascular: Normal rate and regular rhythm.  Exam reveals no gallop and no friction rub.   No murmur heard. Pulmonary/Chest: Effort normal and breath sounds normal. No respiratory distress. She has no wheezes. She has no rales.  Abdominal: Soft. Bowel sounds are normal. She exhibits no distension. There is no  tenderness. There is no rebound.    Musculoskeletal: Normal range of motion.  Neurological: She is alert and oriented to person, place, and time.  Skin: Skin is warm and dry. No rash noted.  Psychiatric: She has a normal mood and affect. Her behavior is normal.    ED Course  Procedures (including critical care time) Labs Review Labs Reviewed  CBC WITH DIFFERENTIAL/PLATELET - Abnormal; Notable for the following:    HCT 35.9 (*)    All other components within normal limits  URINALYSIS, ROUTINE W REFLEX MICROSCOPIC (NOT AT [facility]) - Abnormal; Notable for the following:    Hgb urine dipstick TRACE (*)    All other components within normal limits  URINE MICROSCOPIC-ADD ON - Abnormal; Notable for the following:    Squamous Epithelial / LPF MANY (*)    Bacteria, UA MANY (*)    All other components within normal limits  COMPREHENSIVE METABOLIC PANEL    Imaging Review No results found. I have personally reviewed and evaluated these images and lab results as part of my medical decision-making.   EKG Interpretation None      MDM   Final diagnoses:  Hematoma of obstetric wound   That historically drained some dark blood. No drainage from the wound now. Surrounding evolving ecchymosis. No palpable hematoma. No subcutaneous fluid collection by bedside ultrasound. This is not appear erythematous or anyway infected.  He states it hurts "really really really bad". I'm hesitant to relate this amount of pain to this in very normal-appearing wound. Offered her a very limited number of Vicodin. Keflex as prophylaxis as the wound is open if it is draining. Astra no uncertain terms to actually call her surgeon for a follow-up appointment.    Rolland Porter, MD 10/29/14 [phone]

## 2014-10-29 NOTE — Discharge Instructions (Signed)
Your wound shows no sign of infection, or complication. Minimal drainage from a would is not unexpected.  Your surgeon should be contacted.  You should be re-evaluated by your surgeon.  Warm compresses.

## 2015-10-30 ENCOUNTER — Other Ambulatory Visit: Payer: Self-pay | Admitting: Family

## 2015-10-31 ENCOUNTER — Encounter: Payer: Self-pay | Admitting: Physician Assistant

## 2016-04-26 ENCOUNTER — Other Ambulatory Visit: Payer: Self-pay | Admitting: Internal Medicine

## 2016-04-26 ENCOUNTER — Ambulatory Visit (HOSPITAL_COMMUNITY)
Admission: RE | Admit: 2016-04-26 | Discharge: 2016-04-26 | Disposition: A | Discharge status: Home / Self Care | Payer: Medicaid Other | Source: Ambulatory Visit | Attending: Emergency Medicine | Admitting: Emergency Medicine

## 2016-04-26 ENCOUNTER — Other Ambulatory Visit (HOSPITAL_COMMUNITY): Payer: Self-pay | Admitting: Emergency Medicine

## 2016-04-26 DIAGNOSIS — Z029 Encounter for administrative examinations, unspecified: Secondary | ICD-10-CM | POA: Diagnosis not present

## 2016-06-14 ENCOUNTER — Ambulatory Visit: Payer: Self-pay | Admitting: Physician Assistant

## 2016-06-15 ENCOUNTER — Encounter: Payer: Self-pay | Admitting: Physician Assistant

## 2016-09-07 ENCOUNTER — Encounter: Payer: Medicaid Other | Admitting: Physician Assistant

## 2016-09-21 ENCOUNTER — Encounter: Payer: Medicaid Other | Admitting: Physician Assistant

## 2016-09-22 ENCOUNTER — Encounter: Payer: Self-pay | Admitting: Physician Assistant

## 2018-11-11 ENCOUNTER — Encounter (HOSPITAL_COMMUNITY): Payer: Self-pay | Admitting: Emergency Medicine

## 2018-11-11 ENCOUNTER — Other Ambulatory Visit: Payer: Self-pay

## 2018-11-11 ENCOUNTER — Emergency Department (HOSPITAL_COMMUNITY)
Admission: EM | Admit: 2018-11-11 | Discharge: 2018-11-11 | Disposition: A | Discharge status: Home / Self Care | Payer: Self-pay | Attending: Emergency Medicine | Admitting: Emergency Medicine

## 2018-11-11 DIAGNOSIS — Z79899 Other long term (current) drug therapy: Secondary | ICD-10-CM | POA: Insufficient documentation

## 2018-11-11 DIAGNOSIS — F17210 Nicotine dependence, cigarettes, uncomplicated: Secondary | ICD-10-CM | POA: Insufficient documentation

## 2018-11-11 DIAGNOSIS — F1910 Other psychoactive substance abuse, uncomplicated: Secondary | ICD-10-CM | POA: Insufficient documentation

## 2018-11-11 LAB — URINALYSIS, ROUTINE W REFLEX MICROSCOPIC
Bacteria, UA: NONE SEEN
Bilirubin Urine: NEGATIVE
Glucose, UA: NEGATIVE mg/dL
Ketones, ur: NEGATIVE mg/dL
Leukocytes,Ua: NEGATIVE
Nitrite: NEGATIVE
Protein, ur: NEGATIVE mg/dL
Specific Gravity, Urine: 1.009 (ref 1.005–1.030)
pH: 9 — ABNORMAL HIGH (ref 5.0–8.0)

## 2018-11-11 LAB — COMPREHENSIVE METABOLIC PANEL
ALT: 27 U/L (ref 0–44)
AST: 26 U/L (ref 15–41)
Albumin: 3.7 g/dL (ref 3.5–5.0)
Alkaline Phosphatase: 46 U/L (ref 38–126)
Anion gap: 5 (ref 5–15)
BUN: 6 mg/dL (ref 6–20)
CO2: 26 mmol/L (ref 22–32)
Calcium: 8.8 mg/dL — ABNORMAL LOW (ref 8.9–10.3)
Chloride: 107 mmol/L (ref 98–111)
Creatinine, Ser: 0.55 mg/dL (ref 0.44–1.00)
GFR calc Af Amer: >60 mL/min (ref >60)
GFR calc non Af Amer: >60 mL/min (ref >60)
Glucose, Bld: 110 mg/dL — ABNORMAL HIGH (ref 70–99)
Potassium: 3.9 mmol/L (ref 3.5–5.1)
Sodium: 138 mmol/L (ref 135–145)
Total Bilirubin: 0.5 mg/dL (ref 0.3–1.2)
Total Protein: 7.5 g/dL (ref 6.5–8.1)

## 2018-11-11 LAB — CBC
HCT: 40.9 % (ref 36.0–46.0)
Hemoglobin: 13.1 g/dL (ref 12.0–15.0)
MCH: 29.4 pg (ref 26.0–34.0)
MCHC: 32 g/dL (ref 30.0–36.0)
MCV: 91.7 fL (ref 80.0–100.0)
Platelets: 194 10*3/uL (ref 150–400)
RBC: 4.46 MIL/uL (ref 3.87–5.11)
RDW: 13 % (ref 11.5–15.5)
WBC: 5.6 10*3/uL (ref 4.0–10.5)
nRBC: 0 % (ref 0.0–0.2)

## 2018-11-11 LAB — ETHANOL: Alcohol, Ethyl (B): <10 mg/dL (ref <10)

## 2018-11-11 LAB — RAPID URINE DRUG SCREEN, HOSP PERFORMED
Amphetamines: NOT DETECTED
Barbiturates: NOT DETECTED
Benzodiazepines: NOT DETECTED
Cocaine: NOT DETECTED
Opiates: NOT DETECTED
Tetrahydrocannabinol: NOT DETECTED

## 2018-11-11 LAB — PREGNANCY, URINE: Preg Test, Ur: NEGATIVE

## 2018-11-11 MED ORDER — IBUPROFEN 800 MG PO TABS: 800.0000 mg | ORAL_TABLET | Freq: Three times a day (TID) | ORAL | 0 refills | Status: AC

## 2018-11-11 MED ORDER — SODIUM CHLORIDE 0.9 % IV BOLUS
1000.0000 mL | Freq: Once | INTRAVENOUS | Status: AC
  Administered 2018-11-11: 1000 mL via INTRAVENOUS

## 2018-11-11 MED ORDER — ONDANSETRON 4 MG PO TBDP: 4.0000 mg | ORAL_TABLET | Freq: Three times a day (TID) | ORAL | 0 refills | Status: AC | PRN

## 2018-11-11 MED ORDER — PROMETHAZINE HCL 25 MG/ML IJ SOLN
12.5000 mg | Freq: Once | INTRAMUSCULAR | Status: AC
  Administered 2018-11-11: 12.5 mg via INTRAVENOUS
  Filled 2018-11-11: qty 1

## 2018-11-11 MED ORDER — HYDROXYZINE HCL 25 MG PO TABS: 25.0000 mg | ORAL_TABLET | Freq: Four times a day (QID) | ORAL | 0 refills | Status: AC

## 2018-11-11 NOTE — Discharge Instructions (Signed)
I have provided a resource list for you.  You can also contact the social worker here at the hospital on Monday.  Return here if needed

## 2018-11-11 NOTE — ED Notes (Signed)
Pt given dinner meal tray

## 2018-11-11 NOTE — ED Triage Notes (Signed)
Patient requesting help for heroin addiction. Per patient last used this morning at 3am. Patient states she uses heroin daily. Patient states this is her first time that she has tried to get help. Patient denies any active SI or HI when asked. Patient report headache with nausea and vomiting.

## 2018-11-11 NOTE — ED Notes (Signed)
Pt given graham crackers with peanut butter and a water.

## 2018-11-11 NOTE — ED Provider Notes (Signed)
[facility] EMERGENCY DEPARTMENT Provider Note   CSN: [id number] Arrival date & time: 11/11/18  1339     History   Chief Complaint Chief Complaint  Patient presents with  . Addiction Problem    HPI Alison Warner is a 28 y.o. female.     HPI   Alison Warner is a 28 y.o. 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  She states that she uses heroin daily for at least 1 year.  She is requesting assistance with coming off of the heroin.  She states she last used at 3 AM this morning.  She reports a previous admission in another state for substance abuse, but states this was not for heroin.  She reports nausea and frequent vomiting for 1 to 2 days.  She also endorses a frontal headache she attributes from the vomiting.  She denies any suicidal or homicidal thoughts her plans.  She denies fever, chills, chest pain, shortness of breath, abdominal pain and dysuria and alcohol abuse.  She also admits to being homeless.     Past Medical History:  Diagnosis Date  . Chronic back pain     Patient Active Problem List   Diagnosis Date Noted  . SOB (shortness of breath) 12/07/2011    Past Surgical History:  Procedure Laterality Date  . BACK SURGERY    . CESAREAN SECTION    . TUBAL LIGATION       OB History   No obstetric history on file.      Home Medications    Prior to Admission medications   Medication Sig Start Date End Date Taking? Authorizing Provider  cephALEXin (KEFLEX) 500 MG capsule Take 1 capsule (500 mg total) by mouth 3 (three) times daily. 10/29/14   Tanna Furry, MD  clindamycin (CLEOCIN) 150 MG capsule Take 3 capsules (450 mg total) by mouth 4 (four) times daily. 09/14/14   Leo Grosser, MD  cyclobenzaprine (FLEXERIL) 10 MG tablet Take 1 tablet (10 mg total) by mouth 2 (two) times daily as needed for muscle spasms. Patient not taking: Reported on 09/14/2014 12/08/13   Nat Christen, MD  HYDROcodone-acetaminophen  (NORCO/VICODIN) 5-325 MG per tablet Take 2 tablets by mouth every 4 (four) hours as needed. 10/29/14   Tanna Furry, MD  ibuprofen (ADVIL,MOTRIN) 800 MG tablet Take 800 mg by mouth every 4 (four) hours as needed.    [provider]  naproxen (NAPROSYN) 500 MG tablet Take 1 tablet (500 mg total) by mouth 2 (two) times daily. Patient not taking: Reported on 09/14/2014 12/08/13   Nat Christen, MD  oxyCODONE-acetaminophen (PERCOCET/ROXICET) 5-325 MG per tablet Take 1 tablet by mouth every 4 (four) hours as needed for severe pain.    [provider]    Family History No family history on file.  Social History Social History   Tobacco Use  . Smoking status: Current Every Day Smoker    Packs/day: 0.50    Years: 1.00    Pack years: 0.50    Types: Cigarettes  . Smokeless tobacco: Never Used  Substance Use Topics  . Alcohol use: No  . Drug use: Yes    Types: Methamphetamines    Comment: herion     Allergies   Patient has no known allergies.   Review of Systems Review of Systems  Constitutional: Negative for chills and fever.  Respiratory: Negative for shortness of breath.   Cardiovascular: Negative for chest pain.  Gastrointestinal: Positive for nausea and  vomiting. Negative for abdominal pain and constipation.  Genitourinary: Negative for decreased urine volume, difficulty urinating, dysuria and flank pain.  Musculoskeletal: Negative for back pain, joint swelling, neck pain and neck stiffness.  Skin: Negative for rash.  Neurological: Negative for weakness, numbness and headaches.     Physical Exam Updated Vital Signs BP 131/80 (BP Location: Left Arm)   Pulse 79   Temp 98.3 F (36.8 C) (Oral)   Resp 14   Ht 5\' 10"  (1.778 m)   Wt 82.9 kg   LMP 11/11/2018   SpO2 99%   BMI 26.23 kg/m   Physical Exam Vitals signs and nursing note reviewed.  Constitutional:      Appearance: Normal appearance. She is not ill-appearing or toxic-appearing.  HENT:     Head:  Atraumatic.     Mouth/Throat:     Mouth: Mucous membranes are moist.     Pharynx: Oropharynx is clear.  Eyes:     Extraocular Movements: Extraocular movements intact.     Pupils: Pupils are equal, round, and reactive to light.  Neck:     Musculoskeletal: Normal range of motion.  Cardiovascular:     Rate and Rhythm: Normal rate and regular rhythm.     Pulses: Normal pulses.  Pulmonary:     Effort: Pulmonary effort is normal. No respiratory distress.     Breath sounds: Normal breath sounds.  Chest:     Chest wall: No tenderness.  Abdominal:     General: There is no distension.     Palpations: Abdomen is soft. There is no mass.     Tenderness: There is no abdominal tenderness. There is no guarding.  Musculoskeletal:        General: No tenderness.  Skin:    General: Skin is warm.     Capillary Refill: Capillary refill takes less than 2 seconds.     Findings: No erythema or rash.     Comments: Patient does have track marks to the left antecubital, and dorsal left hand.  No surrounding erythema, excessive warmth, or lymphangitis.  Neurological:     General: No focal deficit present.     Mental Status: She is alert.     Sensory: No sensory deficit.     Motor: No weakness.      ED Treatments / Results  Labs (all labs ordered are listed, but only abnormal results are displayed) Labs Reviewed  COMPREHENSIVE METABOLIC PANEL - Abnormal; Notable for the following components:      Result Value   Glucose, Bld 110 (*)    Calcium 8.8 (*)    All other components within normal limits  URINALYSIS, ROUTINE W REFLEX MICROSCOPIC - Abnormal; Notable for the following components:   Color, Urine STRAW (*)    pH 9.0 (*)    Hgb urine dipstick SMALL (*)    All other components within normal limits  CBC  RAPID URINE DRUG SCREEN, HOSP PERFORMED  ETHANOL  PREGNANCY, URINE    EKG None  Radiology No results found.  Procedures Procedures (including critical care time)  Medications  Ordered in ED Medications  sodium chloride 0.9 % bolus 1,000 mL (0 mLs Intravenous Stopped 11/11/18 1707)  promethazine (PHENERGAN) injection 12.5 mg (12.5 mg Intravenous Given 11/11/18 1526)     Initial Impression / Assessment and Plan / ED Course  I have reviewed the triage vital signs and the nursing notes.  Pertinent labs & imaging results that were available during my care of the patient were reviewed  by me and considered in my medical decision making (see chart for details).        Patient is resting comfortably.  Vital signs reviewed.  She is nontoxic-appearing and does not appear to be in active withdrawal.  UDS today is negative. No concerning sx's for abscess or cellulitis.  Abdomen remains soft and nontender.  No vomiting during ER stay.  She continues to deny hallucinations, SI or HI thought or plan.  She does state that she is homeless and she is requesting assistance with a place to stay. She is requesting food.    I feel that she is appropriate for d/c home.  I will provide resource list and rx for NSAID, anti-emetic and vistaril.     Final Clinical Impressions(s) / ED Diagnoses   Final diagnoses:  Substance abuse [facility](HCC)    ED Discharge Orders    None       Pauline Ausriplett, Keishawn Darsey, PA-C 11/11/18 1757    Vanetta MuldersZackowski, Scott, MD 11/13/18 1227

## 2018-11-11 NOTE — ED Notes (Signed)
Secretary ordered meal tray with EDP approval.
